# Patient Record
Sex: Female | Born: 1950
Health system: Southern US, Community
[De-identification: ages and names within clinical notes are randomized; demographics above are authoritative.]

## PROBLEM LIST (undated history)

## (undated) DIAGNOSIS — E7800 Pure hypercholesterolemia, unspecified: Secondary | ICD-10-CM

## (undated) DIAGNOSIS — I700 Atherosclerosis of aorta: Secondary | ICD-10-CM

## (undated) DIAGNOSIS — R7303 Prediabetes: Secondary | ICD-10-CM

## (undated) DIAGNOSIS — R079 Chest pain, unspecified: Secondary | ICD-10-CM

## (undated) DIAGNOSIS — M1990 Unspecified osteoarthritis, unspecified site: Secondary | ICD-10-CM

## (undated) DIAGNOSIS — I10 Essential (primary) hypertension: Secondary | ICD-10-CM

## (undated) DIAGNOSIS — K219 Gastro-esophageal reflux disease without esophagitis: Secondary | ICD-10-CM

## (undated) DIAGNOSIS — E785 Hyperlipidemia, unspecified: Secondary | ICD-10-CM

## (undated) DIAGNOSIS — I771 Stricture of artery: Secondary | ICD-10-CM

## (undated) DIAGNOSIS — G8929 Other chronic pain: Secondary | ICD-10-CM

## (undated) DIAGNOSIS — M7581 Other shoulder lesions, right shoulder: Secondary | ICD-10-CM

## (undated) DIAGNOSIS — M5136 Other intervertebral disc degeneration, lumbar region: Secondary | ICD-10-CM

## (undated) DIAGNOSIS — I2510 Atherosclerotic heart disease of native coronary artery without angina pectoris: Secondary | ICD-10-CM

## (undated) DIAGNOSIS — M79622 Pain in left upper arm: Secondary | ICD-10-CM

## (undated) DIAGNOSIS — M810 Age-related osteoporosis without current pathological fracture: Secondary | ICD-10-CM

## (undated) DIAGNOSIS — M48061 Spinal stenosis, lumbar region without neurogenic claudication: Secondary | ICD-10-CM

## (undated) DIAGNOSIS — M51369 Other intervertebral disc degeneration, lumbar region without mention of lumbar back pain or lower extremity pain: Secondary | ICD-10-CM

## (undated) DIAGNOSIS — T7840XA Allergy, unspecified, initial encounter: Secondary | ICD-10-CM

## (undated) DIAGNOSIS — K5792 Diverticulitis of intestine, part unspecified, without perforation or abscess without bleeding: Secondary | ICD-10-CM

## (undated) DIAGNOSIS — M549 Dorsalgia, unspecified: Secondary | ICD-10-CM

## (undated) HISTORY — DX: Other shoulder lesions, right shoulder: M75.81

## (undated) HISTORY — DX: Pure hypercholesterolemia, unspecified: E78.00

## (undated) HISTORY — PX: COLONOSCOPY: SHX174

## (undated) HISTORY — DX: Spinal stenosis, lumbar region without neurogenic claudication: M48.061

## (undated) HISTORY — PX: TONSILLECTOMY: SUR1361

## (undated) HISTORY — DX: Allergy, unspecified, initial encounter: T78.40XA

## (undated) HISTORY — DX: Chest pain, unspecified: R07.9

## (undated) HISTORY — DX: Gastro-esophageal reflux disease without esophagitis: K21.9

## (undated) HISTORY — DX: Dorsalgia, unspecified: M54.9

## (undated) HISTORY — PX: ABDOMINAL HYSTERECTOMY: SHX81

## (undated) HISTORY — DX: Pain in left upper arm: M79.622

## (undated) HISTORY — DX: Unspecified osteoarthritis, unspecified site: M19.90

## (undated) HISTORY — DX: Atherosclerosis of aorta: I70.0

## (undated) HISTORY — DX: Other intervertebral disc degeneration, lumbar region: M51.36

## (undated) HISTORY — DX: Hyperlipidemia, unspecified: E78.5

## (undated) HISTORY — DX: Essential (primary) hypertension: I10

## (undated) HISTORY — DX: Stricture of artery: I77.1

## (undated) HISTORY — DX: Prediabetes: R73.03

## (undated) HISTORY — DX: Atherosclerotic heart disease of native coronary artery without angina pectoris: I25.10

## (undated) HISTORY — DX: Other chronic pain: G89.29

## (undated) HISTORY — DX: Other intervertebral disc degeneration, lumbar region without mention of lumbar back pain or lower extremity pain: M51.369

## (undated) HISTORY — DX: Age-related osteoporosis without current pathological fracture: M81.0

---

## 1998-03-27 ENCOUNTER — Emergency Department (HOSPITAL_COMMUNITY): Admission: EM | Admit: 1998-03-27 | Discharge: 1998-03-27 | Payer: Self-pay | Admitting: Emergency Medicine

## 1998-11-30 ENCOUNTER — Encounter: Payer: Self-pay | Admitting: Surgery

## 1998-11-30 ENCOUNTER — Ambulatory Visit (HOSPITAL_COMMUNITY): Admission: RE | Admit: 1998-11-30 | Discharge: 1998-11-30 | Payer: Self-pay | Admitting: Surgery

## 1999-04-10 ENCOUNTER — Emergency Department (HOSPITAL_COMMUNITY): Admission: EM | Admit: 1999-04-10 | Discharge: 1999-04-10 | Payer: Self-pay | Admitting: Emergency Medicine

## 1999-04-10 ENCOUNTER — Encounter: Payer: Self-pay | Admitting: Emergency Medicine

## 1999-12-04 ENCOUNTER — Emergency Department (HOSPITAL_COMMUNITY): Admission: EM | Admit: 1999-12-04 | Discharge: 1999-12-04 | Payer: Self-pay | Admitting: Emergency Medicine

## 2000-09-15 ENCOUNTER — Ambulatory Visit (HOSPITAL_COMMUNITY): Admission: RE | Admit: 2000-09-15 | Discharge: 2000-09-15 | Payer: Self-pay | Admitting: Gastroenterology

## 2001-05-03 ENCOUNTER — Emergency Department (HOSPITAL_COMMUNITY): Admission: EM | Admit: 2001-05-03 | Discharge: 2001-05-03 | Payer: Self-pay | Admitting: Internal Medicine

## 2001-10-03 ENCOUNTER — Encounter: Payer: Self-pay | Admitting: Emergency Medicine

## 2001-10-03 ENCOUNTER — Emergency Department (HOSPITAL_COMMUNITY): Admission: EM | Admit: 2001-10-03 | Discharge: 2001-10-03 | Payer: Self-pay | Admitting: Emergency Medicine

## 2002-05-02 ENCOUNTER — Emergency Department (HOSPITAL_COMMUNITY): Admission: EM | Admit: 2002-05-02 | Discharge: 2002-05-02 | Payer: Self-pay | Admitting: Emergency Medicine

## 2002-07-04 ENCOUNTER — Ambulatory Visit (HOSPITAL_COMMUNITY): Admission: RE | Admit: 2002-07-04 | Discharge: 2002-07-04 | Payer: Self-pay

## 2002-07-04 ENCOUNTER — Encounter: Payer: Self-pay | Admitting: *Deleted

## 2002-07-11 ENCOUNTER — Encounter: Admission: RE | Admit: 2002-07-11 | Discharge: 2002-07-11 | Payer: Self-pay | Admitting: *Deleted

## 2002-07-11 ENCOUNTER — Encounter: Payer: Self-pay | Admitting: *Deleted

## 2003-04-19 ENCOUNTER — Emergency Department (HOSPITAL_COMMUNITY): Admission: EM | Admit: 2003-04-19 | Discharge: 2003-04-19 | Payer: Self-pay | Admitting: Emergency Medicine

## 2003-04-19 ENCOUNTER — Encounter: Payer: Self-pay | Admitting: Emergency Medicine

## 2003-08-07 ENCOUNTER — Encounter: Admission: RE | Admit: 2003-08-07 | Discharge: 2003-08-07 | Payer: Self-pay | Admitting: Internal Medicine

## 2004-09-03 ENCOUNTER — Encounter: Admission: RE | Admit: 2004-09-03 | Discharge: 2004-09-03 | Payer: Self-pay | Admitting: Internal Medicine

## 2004-10-09 ENCOUNTER — Ambulatory Visit (HOSPITAL_COMMUNITY): Admission: RE | Admit: 2004-10-09 | Discharge: 2004-10-09 | Payer: Self-pay | Admitting: Orthopedic Surgery

## 2004-10-09 ENCOUNTER — Ambulatory Visit (HOSPITAL_BASED_OUTPATIENT_CLINIC_OR_DEPARTMENT_OTHER): Admission: RE | Admit: 2004-10-09 | Discharge: 2004-10-09 | Payer: Self-pay | Admitting: Orthopedic Surgery

## 2005-02-26 ENCOUNTER — Emergency Department (HOSPITAL_COMMUNITY): Admission: EM | Admit: 2005-02-26 | Discharge: 2005-02-26 | Payer: Self-pay | Admitting: Emergency Medicine

## 2005-06-10 ENCOUNTER — Ambulatory Visit: Payer: Self-pay | Admitting: Gastroenterology

## 2005-06-20 ENCOUNTER — Ambulatory Visit: Payer: Self-pay | Admitting: Gastroenterology

## 2005-07-04 ENCOUNTER — Ambulatory Visit: Payer: Self-pay | Admitting: Gastroenterology

## 2005-07-14 ENCOUNTER — Encounter (INDEPENDENT_AMBULATORY_CARE_PROVIDER_SITE_OTHER): Payer: Self-pay | Admitting: *Deleted

## 2005-07-14 ENCOUNTER — Ambulatory Visit: Payer: Self-pay | Admitting: Gastroenterology

## 2005-10-22 ENCOUNTER — Encounter: Admission: RE | Admit: 2005-10-22 | Discharge: 2005-10-22 | Payer: Self-pay | Admitting: Internal Medicine

## 2006-09-07 ENCOUNTER — Ambulatory Visit: Payer: Self-pay

## 2006-11-06 ENCOUNTER — Encounter: Admission: RE | Admit: 2006-11-06 | Discharge: 2006-11-06 | Payer: Self-pay | Admitting: Internal Medicine

## 2007-02-24 ENCOUNTER — Emergency Department (HOSPITAL_COMMUNITY): Admission: EM | Admit: 2007-02-24 | Discharge: 2007-02-24 | Payer: Self-pay | Admitting: Family Medicine

## 2007-04-24 ENCOUNTER — Ambulatory Visit (HOSPITAL_COMMUNITY): Admission: RE | Admit: 2007-04-24 | Discharge: 2007-04-24 | Payer: Self-pay | Admitting: Internal Medicine

## 2007-11-01 ENCOUNTER — Ambulatory Visit: Payer: Self-pay | Admitting: Gastroenterology

## 2007-11-08 ENCOUNTER — Ambulatory Visit: Payer: Self-pay | Admitting: Gastroenterology

## 2007-12-13 ENCOUNTER — Encounter: Admission: RE | Admit: 2007-12-13 | Discharge: 2007-12-13 | Payer: Self-pay | Admitting: Internal Medicine

## 2007-12-21 ENCOUNTER — Ambulatory Visit: Payer: Self-pay | Admitting: Gastroenterology

## 2007-12-21 LAB — CONVERTED CEMR LAB
Fecal Occult Blood: NEGATIVE
OCCULT 1: NEGATIVE
OCCULT 2: NEGATIVE
OCCULT 3: NEGATIVE
OCCULT 4: NEGATIVE
OCCULT 5: NEGATIVE

## 2008-01-04 ENCOUNTER — Ambulatory Visit: Payer: Self-pay | Admitting: Gastroenterology

## 2008-04-27 ENCOUNTER — Ambulatory Visit: Payer: Self-pay | Admitting: Internal Medicine

## 2008-05-01 ENCOUNTER — Ambulatory Visit: Payer: Self-pay | Admitting: *Deleted

## 2008-06-22 ENCOUNTER — Ambulatory Visit: Payer: Self-pay | Admitting: Family Medicine

## 2008-06-22 LAB — CONVERTED CEMR LAB
ALT: 19 units/L (ref 0–35)
AST: 18 units/L (ref 0–37)
Albumin: 4.5 g/dL (ref 3.5–5.2)
Alkaline Phosphatase: 71 units/L (ref 39–117)
BUN: 17 mg/dL (ref 6–23)
Basophils Absolute: 0 10*3/uL (ref 0.0–0.1)
Basophils Relative: 0 % (ref 0–1)
CO2: 25 meq/L (ref 19–32)
Calcium: 9.6 mg/dL (ref 8.4–10.5)
Chloride: 105 meq/L (ref 96–112)
Cholesterol: 271 mg/dL — ABNORMAL HIGH (ref 0–200)
Creatinine, Ser: 0.86 mg/dL (ref 0.40–1.20)
Eosinophils Absolute: 0.1 10*3/uL (ref 0.0–0.7)
Eosinophils Relative: 2 % (ref 0–5)
Glucose, Bld: 94 mg/dL (ref 70–99)
HCT: 45 % (ref 36.0–46.0)
HDL: 51 mg/dL (ref >39)
Hemoglobin: 14.1 g/dL (ref 12.0–15.0)
LDL Cholesterol: 190 mg/dL — ABNORMAL HIGH (ref 0–99)
Lymphocytes Relative: 44 % (ref 12–46)
Lymphs Abs: 2.2 10*3/uL (ref 0.7–4.0)
MCHC: 31.3 g/dL (ref 30.0–36.0)
MCV: 82.6 fL (ref 78.0–100.0)
Monocytes Absolute: 0.4 10*3/uL (ref 0.1–1.0)
Monocytes Relative: 9 % (ref 3–12)
Neutro Abs: 2.3 10*3/uL (ref 1.7–7.7)
Neutrophils Relative %: 46 % (ref 43–77)
Platelets: 205 10*3/uL (ref 150–400)
Potassium: 4.2 meq/L (ref 3.5–5.3)
RBC: 5.45 M/uL — ABNORMAL HIGH (ref 3.87–5.11)
RDW: 16.9 % — ABNORMAL HIGH (ref 11.5–15.5)
Sed Rate: 7 mm/hr (ref 0–22)
Sodium: 141 meq/L (ref 135–145)
TSH: 1.01 microintl units/mL (ref 0.350–4.50)
Total Bilirubin: 0.4 mg/dL (ref 0.3–1.2)
Total CHOL/HDL Ratio: 5.3
Total Protein: 6.9 g/dL (ref 6.0–8.3)
Triglycerides: 152 mg/dL — ABNORMAL HIGH (ref <150)
VLDL: 30 mg/dL (ref 0–40)
WBC: 5 10*3/uL (ref 4.0–10.5)

## 2008-08-01 ENCOUNTER — Ambulatory Visit: Payer: Self-pay | Admitting: Family Medicine

## 2008-08-01 LAB — CONVERTED CEMR LAB
ALT: 47 units/L — ABNORMAL HIGH (ref 0–35)
AST: 26 units/L (ref 0–37)
Albumin: 4.4 g/dL (ref 3.5–5.2)
Alkaline Phosphatase: 68 units/L (ref 39–117)
BUN: 15 mg/dL (ref 6–23)
CO2: 24 meq/L (ref 19–32)
Calcium: 9.7 mg/dL (ref 8.4–10.5)
Chloride: 106 meq/L (ref 96–112)
Creatinine, Ser: 0.85 mg/dL (ref 0.40–1.20)
Glucose, Bld: 84 mg/dL (ref 70–99)
Potassium: 4.4 meq/L (ref 3.5–5.3)
Sodium: 141 meq/L (ref 135–145)
Total Bilirubin: 0.6 mg/dL (ref 0.3–1.2)
Total CK: 115 units/L (ref 7–177)
Total Protein: 6.9 g/dL (ref 6.0–8.3)

## 2008-09-04 ENCOUNTER — Ambulatory Visit: Payer: Self-pay | Admitting: Family Medicine

## 2008-09-11 ENCOUNTER — Ambulatory Visit (HOSPITAL_COMMUNITY): Admission: RE | Admit: 2008-09-11 | Discharge: 2008-09-11 | Payer: Self-pay | Admitting: Family Medicine

## 2008-12-13 ENCOUNTER — Encounter: Admission: RE | Admit: 2008-12-13 | Discharge: 2008-12-13 | Payer: Self-pay | Admitting: Family Medicine

## 2008-12-20 ENCOUNTER — Encounter: Admission: RE | Admit: 2008-12-20 | Discharge: 2008-12-20 | Payer: Self-pay | Admitting: Family Medicine

## 2009-01-19 ENCOUNTER — Ambulatory Visit: Payer: Self-pay | Admitting: Family Medicine

## 2009-02-01 ENCOUNTER — Ambulatory Visit: Payer: Self-pay | Admitting: Internal Medicine

## 2009-03-13 ENCOUNTER — Ambulatory Visit: Payer: Self-pay | Admitting: Family Medicine

## 2009-03-15 ENCOUNTER — Ambulatory Visit: Payer: Self-pay | Admitting: Internal Medicine

## 2009-07-13 ENCOUNTER — Ambulatory Visit: Payer: Self-pay | Admitting: Internal Medicine

## 2009-08-17 ENCOUNTER — Ambulatory Visit: Payer: Self-pay | Admitting: Family Medicine

## 2009-11-08 ENCOUNTER — Ambulatory Visit: Payer: Self-pay | Admitting: Internal Medicine

## 2009-11-08 LAB — CONVERTED CEMR LAB
Cholesterol: 161 mg/dL (ref 0–200)
HDL: 52 mg/dL (ref >39)
LDL Cholesterol: 89 mg/dL (ref 0–99)
Total CHOL/HDL Ratio: 3.1
Triglycerides: 98 mg/dL (ref <150)
VLDL: 20 mg/dL (ref 0–40)

## 2009-12-17 ENCOUNTER — Encounter: Admission: RE | Admit: 2009-12-17 | Discharge: 2009-12-17 | Payer: Self-pay | Admitting: Family Medicine

## 2009-12-20 ENCOUNTER — Ambulatory Visit: Payer: Self-pay | Admitting: Internal Medicine

## 2010-02-21 ENCOUNTER — Ambulatory Visit: Payer: Self-pay | Admitting: Internal Medicine

## 2010-09-16 ENCOUNTER — Emergency Department (HOSPITAL_COMMUNITY)
Admission: EM | Admit: 2010-09-16 | Discharge: 2010-09-16 | Payer: Self-pay | Source: Home / Self Care | Admitting: Emergency Medicine

## 2010-12-09 LAB — CBC
HCT: 42.1 % (ref 36.0–46.0)
Hemoglobin: 13.4 g/dL (ref 12.0–15.0)
MCH: 26.1 pg (ref 26.0–34.0)
MCHC: 31.8 g/dL (ref 30.0–36.0)
MCV: 82.1 fL (ref 78.0–100.0)
Platelets: 175 10*3/uL (ref 150–400)
RBC: 5.13 MIL/uL — ABNORMAL HIGH (ref 3.87–5.11)
RDW: 15.7 % — ABNORMAL HIGH (ref 11.5–15.5)
WBC: 6.1 10*3/uL (ref 4.0–10.5)

## 2010-12-09 LAB — POCT I-STAT, CHEM 8
BUN: 17 mg/dL (ref 6–23)
Calcium, Ion: 1.17 mmol/L (ref 1.12–1.32)
Chloride: 109 mEq/L (ref 96–112)
Creatinine, Ser: 0.8 mg/dL (ref 0.4–1.2)
Glucose, Bld: 94 mg/dL (ref 70–99)
HCT: 44 % (ref 36.0–46.0)
Hemoglobin: 15 g/dL (ref 12.0–15.0)
Potassium: 4.1 mEq/L (ref 3.5–5.1)
Sodium: 144 mEq/L (ref 135–145)
TCO2: 26 mmol/L (ref 0–100)

## 2011-02-03 ENCOUNTER — Other Ambulatory Visit (HOSPITAL_COMMUNITY): Payer: Self-pay | Admitting: *Deleted

## 2011-02-03 DIAGNOSIS — Z1231 Encounter for screening mammogram for malignant neoplasm of breast: Secondary | ICD-10-CM

## 2011-02-07 ENCOUNTER — Ambulatory Visit (HOSPITAL_COMMUNITY)
Admission: RE | Admit: 2011-02-07 | Discharge: 2011-02-07 | Disposition: A | Discharge status: Home / Self Care | Payer: Self-pay | Source: Ambulatory Visit | Attending: *Deleted | Admitting: *Deleted

## 2011-02-07 DIAGNOSIS — Z1231 Encounter for screening mammogram for malignant neoplasm of breast: Secondary | ICD-10-CM

## 2011-02-11 NOTE — Assessment & Plan Note (Signed)
[facility]                         GASTROENTEROLOGY OFFICE NOTE   NAME:HERRINGPrecious, Spence                      MRN:          [id number]  DATE:01/04/2008                            DOB:          1951-08-13    Veronica Spence is a 60 year old African-American female who is referred  for evaluation and treatment of reflux symptoms, dyspepsia, subxyphoid  pain, and solid food dysphagia, all of which has progressed over the  last several months despite taking maintenance Protonix therapy.  I  previously saw this patient and performed endoscopy for similar problems  and guaiac positive stools in February of 2009.  Colonoscopy at that  time was unremarkable.  She has previously had endoscopy in October of  2006 and had H. pylori and was treated with Prevpac.   She currently is having regular bowel movements on fiber supplements and  denies melena or hematochezia or hemorrhoidal bleeding.  She has known  lactose intolerance and is currently taking fiber supplements.  Her  appetite is good and her weight is stable.  She denies abuse of alcohol,  cigarettes, or NSAIDS.   MEDICATIONS:  1. She is on Vytorin 10 mg a day.  2. Aspirin 81 mg a day.  3. Protonix 40 mg a day.  4. P.r.n. Xanax.  5. Calcium with vitamin D.   She weighs 176 pounds.  Her blood pressure is 102/56.  I could not  appreciate stigmata of chronic liver disease, thyromegaly or  lymphadenopathy.  Her chest was clear and she was in a regular rhythm without murmurs,  gallops or rubs.  There was no hepatosplenomegaly, abdominal masses or tenderness noted.  There was no chest wall tenderness.  Bowel sounds were normal.  PERIPHERAL EXTREMITIES:  Were unremarkable.  Mental status was clear.   ASSESSMENT:  1. Chronic gastroesophageal reflux disease - rule out peptic stricture      esophagus.  2. Rule out recurrent H. pylori infection.  3. Constipation, predominate irritable bowel syndrome with    diverticulosis coli.  4. History of hyperlipidemia.   RECOMMENDATION:  1. Increased Protonix to 40 mg twice a day 30 minutes before breakfast      and supper.  2. Benefiber in cereal each morning with liberal p.o. fluids.  3. Follow up endoscopic exam.  4. Other medications per Dr. Darylene Price. Jarold Motto, MD, Caleen Essex, FAGA  Electronically Signed    DRP/MedQ  DD: 01/04/2008  DT: 01/04/2008  Job #: [id number]   cc:   Jonita Albee, M.D.

## 2011-02-14 NOTE — Procedures (Signed)
[facility]  Patient:    Veronica Spence, Veronica Spence                      MRN: [id number] Proc. Date: 09/15/00 Adm. Date:  [id number] Attending:  Orland Mustard CC:         Joycelyn Rua, M.D.   Procedure Report  PROCEDURE:  Colonoscopy.  MEDICATIONS:  Fentanyl 87.5 mcg, Versed 10 mg IV.  INDICATIONS FOR PROCEDURE:  Hematochezia.  DESCRIPTION OF PROCEDURE:  The procedure had been explained to the patient and consent obtained. With the patient in the left lateral decubitus position, the adult Olympus video colonoscope was inserted, advanced under direct vision. The prep was quite good. We were able to advance to the cecum with minimal difficulty. The ileocecal valve and appendiceal orifice were clearly seen. The scope was withdrawn and the cecum, ascending colon, hepatic flexure, transverse colon, splenic flexure, descending and sigmoid colon were seen well upon removal. No abnormalities seen. No polyps, no significant diverticular disease, no tumor masses. The scope was withdrawn to the rectum. The patient did have moderate internal hemorrhoids in the rectum. No polyps were seen. She tolerated the procedure well and was maintained on low flow oxygen and pulse oximeter throughout the procedure with no obvious problems.  ASSESSMENT:  Internal hemorrhoids probably the source of her hematochezia.  PLAN:  Will give instructions about hemorrhoids and plan on seeing back in the office in 2-3 months. DD:  09/15/00 TD:  09/16/00 Job: [id number]

## 2011-02-14 NOTE — Op Note (Signed)
Veronica Spence, EBARB             ACCOUNT NO.:  [id number]   MEDICAL RECORD NO.:  [id number]          PATIENT TYPE:  AMB   LOCATION:  DSC                          FACILITY:  [facility]   PHYSICIAN:  Cindee Salt, M.D.       DATE OF BIRTH:  1951/02/05   DATE OF PROCEDURE:  10/09/2004  DATE OF DISCHARGE:                                 OPERATIVE REPORT   PREOPERATIVE DIAGNOSIS:  Carpal tunnel syndrome right hand.   POSTOPERATIVE DIAGNOSIS:  Carpal tunnel syndrome right hand.   OPERATION:  Decompression right median nerve.   SURGEON:  Cindee Salt, M.D.   ASSISTANTCarolyne Fiscal.   ANESTHESIA:  Forearm based IV regional.   HISTORY:  The patient is 60 year old female with a history of carpal tunnel  syndrome EMG nerve conductions positive which has not responded to  conservative to conservative treatment.   PROCEDURE:  The patient is brought to the operating room where forearm based  IV regional anesthetic was carried out without difficulty. She was prepped  using DuraPrep, supine position, right arm free. A longitudinal incision was  made in the palm and carried down through subcutaneous tissue. Bleeders were  electrocauterized. Palmar fascia was split. Superficial palmar arch  identified.  The  flexor tendon to the ring and little finger identified to  the ulnar side of the median nerve.  The carpal retinaculum was incised with  sharp dissection.  Right angle and Sewell retractor were placed between skin  form fashion. The fascia was released for approximately 1.5 cm proximal to  the wrist crease under direct vision.  Canal was explored and no further  lesions were identified.  Tenosynovial tissue was markedly thickened.  The  wound was irrigated. Skin was closed interrupted 5-0 nylon sutures. A  sterile compressive dressing and splint was applied. The patient tolerated  the procedure well and was taken to the recovery room for observation in  satisfactory condition. She is discharged home  drains of [place] in one  week on Vicodin,       GK/MEDQ  D:  10/09/2004  T:  10/09/2004  Job:  [id number]

## 2011-03-07 ENCOUNTER — Emergency Department (HOSPITAL_COMMUNITY)
Admission: EM | Admit: 2011-03-07 | Discharge: 2011-03-07 | Disposition: A | Discharge status: Home / Self Care | Payer: Self-pay | Attending: Emergency Medicine | Admitting: Emergency Medicine

## 2011-03-07 DIAGNOSIS — N390 Urinary tract infection, site not specified: Secondary | ICD-10-CM | POA: Insufficient documentation

## 2011-03-07 DIAGNOSIS — M545 Low back pain, unspecified: Secondary | ICD-10-CM | POA: Insufficient documentation

## 2011-03-07 LAB — URINALYSIS, ROUTINE W REFLEX MICROSCOPIC
Ketones, ur: NEGATIVE mg/dL
Protein, ur: NEGATIVE mg/dL
Specific Gravity, Urine: 1.023 (ref 1.005–1.030)
Urobilinogen, UA: 0.2 mg/dL (ref 0.0–1.0)

## 2011-03-07 LAB — URINE MICROSCOPIC-ADD ON

## 2012-04-15 ENCOUNTER — Other Ambulatory Visit (HOSPITAL_COMMUNITY): Payer: Self-pay | Admitting: Family Medicine

## 2012-04-15 DIAGNOSIS — Z1231 Encounter for screening mammogram for malignant neoplasm of breast: Secondary | ICD-10-CM

## 2012-05-04 ENCOUNTER — Ambulatory Visit (HOSPITAL_COMMUNITY)
Admission: RE | Admit: 2012-05-04 | Discharge: 2012-05-04 | Disposition: A | Discharge status: Home / Self Care | Payer: Self-pay | Source: Ambulatory Visit | Attending: Family Medicine | Admitting: Family Medicine

## 2012-05-04 DIAGNOSIS — Z1231 Encounter for screening mammogram for malignant neoplasm of breast: Secondary | ICD-10-CM | POA: Insufficient documentation

## 2012-05-05 ENCOUNTER — Emergency Department (HOSPITAL_COMMUNITY): Payer: Self-pay

## 2012-05-05 ENCOUNTER — Encounter (HOSPITAL_COMMUNITY): Payer: Self-pay | Admitting: *Deleted

## 2012-05-05 ENCOUNTER — Emergency Department (HOSPITAL_COMMUNITY)
Admission: EM | Admit: 2012-05-05 | Discharge: 2012-05-05 | Disposition: A | Discharge status: Home / Self Care | Payer: Self-pay | Attending: Emergency Medicine | Admitting: Emergency Medicine

## 2012-05-05 DIAGNOSIS — N390 Urinary tract infection, site not specified: Secondary | ICD-10-CM | POA: Insufficient documentation

## 2012-05-05 DIAGNOSIS — F17200 Nicotine dependence, unspecified, uncomplicated: Secondary | ICD-10-CM | POA: Insufficient documentation

## 2012-05-05 DIAGNOSIS — E7800 Pure hypercholesterolemia, unspecified: Secondary | ICD-10-CM | POA: Insufficient documentation

## 2012-05-05 DIAGNOSIS — Z90710 Acquired absence of both cervix and uterus: Secondary | ICD-10-CM | POA: Insufficient documentation

## 2012-05-05 HISTORY — DX: Pure hypercholesterolemia, unspecified: E78.00

## 2012-05-05 HISTORY — DX: Diverticulitis of intestine, part unspecified, without perforation or abscess without bleeding: K57.92

## 2012-05-05 LAB — POCT I-STAT, CHEM 8
Calcium, Ion: 1.23 mmol/L (ref 1.13–1.30)
Glucose, Bld: 91 mg/dL (ref 70–99)
HCT: 42 % (ref 36.0–46.0)
Hemoglobin: 14.3 g/dL (ref 12.0–15.0)
TCO2: 22 mmol/L (ref 0–100)

## 2012-05-05 LAB — URINALYSIS, ROUTINE W REFLEX MICROSCOPIC
Glucose, UA: NEGATIVE mg/dL
Ketones, ur: NEGATIVE mg/dL
Nitrite: NEGATIVE
Protein, ur: NEGATIVE mg/dL
Specific Gravity, Urine: 1.023 (ref 1.005–1.030)
Urobilinogen, UA: 0.2 mg/dL (ref 0.0–1.0)
pH: 6 (ref 5.0–8.0)

## 2012-05-05 LAB — CBC WITH DIFFERENTIAL/PLATELET
Basophils Relative: 0 % (ref 0–1)
Eosinophils Absolute: 0.1 10*3/uL (ref 0.0–0.7)
HCT: 40.9 % (ref 36.0–46.0)
Hemoglobin: 13.2 g/dL (ref 12.0–15.0)
MCH: 25.5 pg — ABNORMAL LOW (ref 26.0–34.0)
MCHC: 32.3 g/dL (ref 30.0–36.0)
Monocytes Absolute: 0.5 10*3/uL (ref 0.1–1.0)
Monocytes Relative: 10 % (ref 3–12)
Neutrophils Relative %: 47 % (ref 43–77)

## 2012-05-05 MED ORDER — CIPROFLOXACIN HCL 500 MG PO TABS: 500.0000 mg | ORAL_TABLET | Freq: Two times a day (BID) | ORAL | Status: AC

## 2012-05-05 MED ORDER — DEXTROSE 5 % IV SOLN: 1.0000 g | INTRAVENOUS | Status: DC

## 2012-05-05 MED ORDER — CIPROFLOXACIN HCL 500 MG PO TABS
500.0000 mg | ORAL_TABLET | Freq: Once | ORAL | Status: AC
  Administered 2012-05-05: 500 mg via ORAL
  Filled 2012-05-05: qty 1

## 2012-05-05 NOTE — ED Provider Notes (Signed)
History     CSN: [id number]  Arrival date & time 05/05/12  1332   First MD Initiated Contact with Patient 05/05/12 1349      Chief Complaint  Patient presents with  . Generalized Body Aches  . Abdominal Pain    (Consider location/radiation/quality/duration/timing/severity/associated sxs/prior treatment) Patient is a 61 y.o. female presenting with abdominal pain. The history is provided by the patient.  Abdominal Pain The primary symptoms of the illness include abdominal pain.   patient here with myalgias and joint aches x3 days. Cough has been slightly productive. Some lower back pain with dysuria. Patient denies fever, vomiting, diarrhea. Denies abdominal pain or chest pain. Has used over-the-counter medications without relief. History of similar symptoms in the past associated with pneumonia  Past Medical History  Diagnosis Date  . Hypercholesteremia   . Diverticulitis     Past Surgical History  Procedure Date  . Abdominal hysterectomy     No family history on file.  History  Substance Use Topics  . Smoking status: Current Everyday Smoker  . Smokeless tobacco: Not on file  . Alcohol Use: No    OB History    Grav Para Term Preterm Abortions TAB SAB Ect Mult Living                  Review of Systems  Gastrointestinal: Positive for abdominal pain.  All other systems reviewed and are negative.    Allergies  Review of patient's allergies indicates no known allergies.  Home Medications   Current Outpatient Rx  Name Route Sig Dispense Refill  . ASPIRIN 81 MG PO CHEW Oral Chew 81 mg by mouth daily.    Marland Kitchen EZETIMIBE-SIMVASTATIN 10-20 MG PO TABS Oral Take 1 tablet by mouth at bedtime.    . CENTRUM SILVER PO Oral Take 1 tablet by mouth daily.      BP 135/72  Pulse 70  Temp 98.6 F (37 C) (Oral)  Resp 16  SpO2 99%  Physical Exam  Nursing note and vitals reviewed. Constitutional: She is oriented to person, place, and time. Vital signs are normal. She  appears well-developed and well-nourished.  Non-toxic appearance. No distress.  HENT:  Head: Normocephalic and atraumatic.  Eyes: Conjunctivae, EOM and lids are normal. Pupils are equal, round, and reactive to light.  Neck: Normal range of motion. Neck supple. No tracheal deviation present. No mass present.  Cardiovascular: Normal rate, regular rhythm and normal heart sounds.  Exam reveals no gallop.   No murmur heard. Pulmonary/Chest: Effort normal and breath sounds normal. No stridor. No respiratory distress. She has no decreased breath sounds. She has no wheezes. She has no rhonchi. She has no rales.  Abdominal: Soft. Normal appearance and bowel sounds are normal. She exhibits no distension. There is no tenderness. There is no rebound and no CVA tenderness.  Musculoskeletal: Normal range of motion. She exhibits no edema and no tenderness.  Neurological: She is alert and oriented to person, place, and time. She has normal strength. No cranial nerve deficit or sensory deficit. GCS eye subscore is 4. GCS verbal subscore is 5. GCS motor subscore is 6.  Skin: Skin is warm and dry. No abrasion and no rash noted.  Psychiatric: She has a normal mood and affect. Her speech is normal and behavior is normal.    ED Course  Procedures (including critical care time)   Labs Reviewed  URINALYSIS, ROUTINE W REFLEX MICROSCOPIC  URINE CULTURE  CBC WITH DIFFERENTIAL   Mm Digital Screening  05/05/2012  *RADIOLOGY REPORT*  Clinical Data: Screening.  DIGITAL BILATERAL SCREENING MAMMOGRAM WITH CAD  Comparison:  Previous exams.  Findings:  The breast tissue is heterogeneously dense. No suspicious masses, architectural distortion, or calcifications are present.  Images were processed with CAD.  IMPRESSION: No mammographic evidence of malignancy.  A result letter of this screening mammogram will be mailed directly to the patient.  RECOMMENDATION: Screening mammogram in one year. (Code:SM-B-01Y)  BI-RADS CATEGORY 1:   Negative.  Original Report Authenticated By: [email or web address]     No diagnosis found.    MDM  Patient to be treated for her UTI with Cipro Floxin       Toy Baker, MD 05/05/12 1527

## 2012-05-05 NOTE — Progress Notes (Signed)
CM spoke with pt while in [facility] ED She confirms she is self pay [facility] resident with no pcp. Discussed the importance of a pcp for f/u. Reviewed Health connect number to assist with finding self pay provider close to pt's residence. Reviewed resources for [facility], general medical clinics, medications-needymeds.com, [facility] out [facility], housing, DSS, health Department and other resources in [place]. Pt voiced understanding and appreciation of resources provided

## 2012-05-05 NOTE — ED Notes (Signed)
Pt reports generalized body aches, abdominal pain since Sunday. One episode diarrhea on Monday. No nausea/vomiting. Denies fever. Reports diaphoresis.

## 2012-05-07 LAB — URINE CULTURE

## 2013-03-17 ENCOUNTER — Emergency Department (HOSPITAL_COMMUNITY)
Admission: EM | Admit: 2013-03-17 | Discharge: 2013-03-17 | Disposition: A | Discharge status: Home / Self Care | Payer: Self-pay | Attending: Emergency Medicine | Admitting: Emergency Medicine

## 2013-03-17 ENCOUNTER — Encounter (HOSPITAL_COMMUNITY): Payer: Self-pay | Admitting: Emergency Medicine

## 2013-03-17 DIAGNOSIS — Z8719 Personal history of other diseases of the digestive system: Secondary | ICD-10-CM | POA: Insufficient documentation

## 2013-03-17 DIAGNOSIS — Z7982 Long term (current) use of aspirin: Secondary | ICD-10-CM | POA: Insufficient documentation

## 2013-03-17 DIAGNOSIS — F17200 Nicotine dependence, unspecified, uncomplicated: Secondary | ICD-10-CM | POA: Insufficient documentation

## 2013-03-17 DIAGNOSIS — L02219 Cutaneous abscess of trunk, unspecified: Secondary | ICD-10-CM | POA: Insufficient documentation

## 2013-03-17 DIAGNOSIS — R300 Dysuria: Secondary | ICD-10-CM | POA: Insufficient documentation

## 2013-03-17 DIAGNOSIS — R3915 Urgency of urination: Secondary | ICD-10-CM | POA: Insufficient documentation

## 2013-03-17 DIAGNOSIS — M5432 Sciatica, left side: Secondary | ICD-10-CM

## 2013-03-17 DIAGNOSIS — E7800 Pure hypercholesterolemia, unspecified: Secondary | ICD-10-CM | POA: Insufficient documentation

## 2013-03-17 DIAGNOSIS — N61 Mastitis without abscess: Secondary | ICD-10-CM

## 2013-03-17 DIAGNOSIS — Z87440 Personal history of urinary (tract) infections: Secondary | ICD-10-CM | POA: Insufficient documentation

## 2013-03-17 DIAGNOSIS — M5430 Sciatica, unspecified side: Secondary | ICD-10-CM | POA: Insufficient documentation

## 2013-03-17 LAB — POCT I-STAT, CHEM 8
BUN: 12 mg/dL (ref 6–23)
Creatinine, Ser: 0.8 mg/dL (ref 0.50–1.10)
Sodium: 143 mEq/L (ref 135–145)
TCO2: 26 mmol/L (ref 0–100)

## 2013-03-17 LAB — URINALYSIS, ROUTINE W REFLEX MICROSCOPIC
Glucose, UA: NEGATIVE mg/dL
Specific Gravity, Urine: 1.026 (ref 1.005–1.030)
Urobilinogen, UA: 0.2 mg/dL (ref 0.0–1.0)

## 2013-03-17 MED ORDER — HYDROCODONE-ACETAMINOPHEN 5-325 MG PO TABS: 2.0000 | ORAL_TABLET | ORAL | Status: DC | PRN

## 2013-03-17 MED ORDER — SULFAMETHOXAZOLE-TRIMETHOPRIM 800-160 MG PO TABS: 1.0000 | ORAL_TABLET | Freq: Two times a day (BID) | ORAL | Status: DC

## 2013-03-17 MED ORDER — METHOCARBAMOL 500 MG PO TABS: 500.0000 mg | ORAL_TABLET | Freq: Two times a day (BID) | ORAL | Status: DC

## 2013-03-17 NOTE — ED Notes (Signed)
Pt states that she has dark urine and back pain for the past 3 days now  With some frequency states that she thinks it might be a uti.

## 2013-03-17 NOTE — ED Provider Notes (Signed)
History     CSN: [id number]  Arrival date & time 03/17/13  1316   First MD Initiated Contact with Patient 03/17/13 1319      Chief Complaint  Patient presents with  . Back Pain  . Urinary Frequency    (Consider location/radiation/quality/duration/timing/severity/associated sxs/prior treatment) HPI  Veronica Spence is a 62 y.o.female presenting to the ER with complaints of urinary frequency, hesitancy and dark urine for 3 days. Also she is having left hip pain that radiates down her left leg that started yesterday. She tells me otherwise she has felt good. Hx of UTIs. No fevers,  n/v/d weakness, no flank or abdominal pains.. She declines pain medication when offered. She has borderline high cholesterol but denies any other medical conditions and says she is still very active.   Patient also wants me to evaluate her right breast.She got bit by some insects a few days ago and now it is tender and red at the area.   Past Medical History  Diagnosis Date  . Hypercholesteremia   . Diverticulitis     Past Surgical History  Procedure Laterality Date  . Abdominal hysterectomy      No family history on file.  History  Substance Use Topics  . Smoking status: Current Every Day Smoker  . Smokeless tobacco: Not on file  . Alcohol Use: No    OB History   Grav Para Term Preterm Abortions TAB SAB Ect Mult Living                  Review of Systems  Genitourinary: Positive for dysuria, urgency and frequency.  Musculoskeletal: Positive for back pain.  All other systems reviewed and are negative.    Allergies  Review of patient's allergies indicates no known allergies.  Home Medications   Current Outpatient Rx  Name  Route  Sig  Dispense  Refill  . aspirin EC 81 MG tablet   Oral   Take 81 mg by mouth daily.         . calcium carbonate (OS-CAL) 600 MG TABS   Oral   Take 600 mg by mouth 2 (two) times daily with a meal.         . HYDROcodone-acetaminophen  (NORCO/VICODIN) 5-325 MG per tablet   Oral   Take 1 tablet by mouth every 6 (six) hours as needed for pain.         . Multiple Vitamin (MULTIVITAMIN WITH MINERALS) TABS   Oral   Take 1 tablet by mouth daily.         Marland Kitchen HYDROcodone-acetaminophen (NORCO/VICODIN) 5-325 MG per tablet   Oral   Take 2 tablets by mouth every 4 (four) hours as needed for pain.   6 tablet   0   . methocarbamol (ROBAXIN) 500 MG tablet   Oral   Take 1 tablet (500 mg total) by mouth 2 (two) times daily.   20 tablet   0   . sulfamethoxazole-trimethoprim (SEPTRA DS) 800-160 MG per tablet   Oral   Take 1 tablet by mouth every 12 (twelve) hours.   10 tablet   0     BP 131/80  Pulse 98  Temp(Src) 99 F (37.2 C) (Oral)  Resp 16  Ht 5\' 6"  (1.676 m)  Wt 198 lb (89.812 kg)  BMI 31.97 kg/m2  SpO2 99%  Physical Exam  Nursing note and vitals reviewed. Constitutional: She appears well-developed and well-nourished. No distress.  HENT:  Head: Normocephalic and atraumatic.  Eyes: Pupils  are equal, round, and reactive to light.  Neck: Normal range of motion. Neck supple.  Cardiovascular: Normal rate and regular rhythm.   Pulmonary/Chest: Effort normal.    Abdominal: Soft. She exhibits no distension. There is no tenderness. There is no rebound and no guarding.  Musculoskeletal:       Left hip: She exhibits normal range of motion, normal strength, no tenderness, no bony tenderness, no swelling, no crepitus, no deformity and no laceration.       Legs: No decreased strength or ROM.  Neurological: She is alert.  Skin: Skin is warm and dry.    ED Course  Procedures (including critical care time)  Labs Reviewed  URINALYSIS, ROUTINE W REFLEX MICROSCOPIC - Abnormal; Notable for the following:    Hgb urine dipstick SMALL (*)    Leukocytes, UA SMALL (*)    All other components within normal limits  URINE MICROSCOPIC-ADD ON - Abnormal; Notable for the following:    Squamous Epithelial / LPF FEW (*)     All other components within normal limits  URINE CULTURE  POCT I-STAT, CHEM 8   No results found.   1. Sciatica neuralgia, left   2. Cellulitis of breast       MDM  Patients urine does not show any significant abnormalities. Delay in getting CBC and BMP because of access to chem 8 ordered. Pt symptoms appear to be more of a sciatica without midline tenderness or weakness, pain in the leg. No bowel or urine incontinence. I offered/recommended pt CT scan or abd/pelv but she declined because she is pressed for time and needs to get her grandkids. She said she is not really having back pain or abdominal pain. Says she will come back to the ER if anything changes.     i-stat 8 is all WNL.  Rx: Bactrim for cellulitis.      62 y.o.Veronica Spence's evaluation in the Emergency Department is complete. It has been determined that no acute conditions requiring further emergency intervention are present at this time. The patient/guardian have been advised of the diagnosis and plan. We have discussed signs and symptoms that warrant return to the ED, such as changes or worsening in symptoms.  Vital signs are stable at discharge. Filed Vitals:   03/17/13 1349  BP: 131/80  Pulse: 98  Temp: 99 F (37.2 C)  Resp: 16    Patient/guardian has voiced understanding and agreed to follow-up with the PCP or specialist.      Dorthula Matas, PA-C 03/17/13 1653

## 2013-03-17 NOTE — ED Notes (Signed)
PA and RN at bedside:  Recommended abdominal CT scan but pt refused because she must pick up her grandchildren.  PA requested that pt come back for a CT scan if pain increases.

## 2013-03-17 NOTE — Progress Notes (Addendum)
During [facility] ED 03/17/13 visit CM spoke with pt who confirms self pay [facility] resident with no pcp. States she was previously a health serve with an expired orange card Pt gave Cm permission to provide her name to [facility] liaison to assist with renewal of orange card Cm also provided her with the following information: [facility] community care network assists with discounted doctors through "orange card services Call Ewell Poe at [phone]  CM discussed and provided written information for self pay pcps, importance of pcp for f/u care, www.needymeds.org, discounted pharmacies and other [place] resources such as financial assistance, DSS and  health department  Reviewed resources for [employer] self pay pcps like [facility], family medicine at [address], [place] [facility], general medical clinics, [facility] urgent care plus others, [facility] and housing Pt voiced understanding and appreciation of resources provided  Pt stated EDP states she has sciatica issues Pt stated she was thirsty and wanted food ED RN notified

## 2013-03-18 LAB — URINE CULTURE

## 2013-03-18 NOTE — ED Provider Notes (Signed)
Medical screening examination/treatment/procedure(s) were performed by non-physician practitioner and as supervising physician I was immediately available for consultation/collaboration.   Celene Kras, MD 03/18/13 [phone]

## 2013-03-18 NOTE — Progress Notes (Signed)
Referral provided to [facility] so a letter and information can be sent to pt abotu "orange card" re instatement/application as pt provided permission for

## 2013-04-21 ENCOUNTER — Other Ambulatory Visit (HOSPITAL_COMMUNITY): Payer: Self-pay | Admitting: General Practice

## 2013-05-10 ENCOUNTER — Other Ambulatory Visit (HOSPITAL_COMMUNITY): Payer: Self-pay | Admitting: General Practice

## 2013-05-10 DIAGNOSIS — Z1231 Encounter for screening mammogram for malignant neoplasm of breast: Secondary | ICD-10-CM

## 2013-05-19 ENCOUNTER — Ambulatory Visit (HOSPITAL_COMMUNITY)
Admission: RE | Admit: 2013-05-19 | Discharge: 2013-05-19 | Disposition: A | Discharge status: Home / Self Care | Payer: Self-pay | Source: Ambulatory Visit | Attending: General Practice | Admitting: General Practice

## 2013-05-19 DIAGNOSIS — Z1231 Encounter for screening mammogram for malignant neoplasm of breast: Secondary | ICD-10-CM

## 2013-08-30 ENCOUNTER — Emergency Department (HOSPITAL_COMMUNITY)
Admission: EM | Admit: 2013-08-30 | Discharge: 2013-08-30 | Disposition: A | Discharge status: Home / Self Care | Payer: No Typology Code available for payment source | Attending: Emergency Medicine | Admitting: Emergency Medicine

## 2013-08-30 ENCOUNTER — Encounter (HOSPITAL_COMMUNITY): Payer: Self-pay | Admitting: Emergency Medicine

## 2013-08-30 DIAGNOSIS — F17200 Nicotine dependence, unspecified, uncomplicated: Secondary | ICD-10-CM | POA: Insufficient documentation

## 2013-08-30 DIAGNOSIS — K047 Periapical abscess without sinus: Secondary | ICD-10-CM | POA: Insufficient documentation

## 2013-08-30 DIAGNOSIS — Z8719 Personal history of other diseases of the digestive system: Secondary | ICD-10-CM | POA: Insufficient documentation

## 2013-08-30 DIAGNOSIS — E7800 Pure hypercholesterolemia, unspecified: Secondary | ICD-10-CM | POA: Insufficient documentation

## 2013-08-30 DIAGNOSIS — Z79899 Other long term (current) drug therapy: Secondary | ICD-10-CM | POA: Insufficient documentation

## 2013-08-30 DIAGNOSIS — Z7982 Long term (current) use of aspirin: Secondary | ICD-10-CM | POA: Insufficient documentation

## 2013-08-30 MED ORDER — HYDROCODONE-ACETAMINOPHEN 5-325 MG PO TABS: 1.0000 | ORAL_TABLET | Freq: Four times a day (QID) | ORAL | Status: DC | PRN

## 2013-08-30 MED ORDER — PENICILLIN V POTASSIUM 500 MG PO TABS: 500.0000 mg | ORAL_TABLET | Freq: Four times a day (QID) | ORAL | Status: DC

## 2013-08-30 MED ORDER — IBUPROFEN 800 MG PO TABS
800.0000 mg | ORAL_TABLET | Freq: Once | ORAL | Status: AC
  Administered 2013-08-30: 800 mg via ORAL
  Filled 2013-08-30: qty 1

## 2013-08-30 NOTE — ED Notes (Signed)
Pt states last night right side lower gums were bothering her and she woke up this morning to the right lower side swollen and hard. 5/10 pain.  Denies injury or drainage.

## 2013-08-30 NOTE — ED Provider Notes (Signed)
CSN: [id number]     Arrival date & time 08/30/13  1232 History   First MD Initiated Contact with Patient 08/30/13 1308   This chart was scribed for non-physician practitioner Francee Piccolo, PA-C working with Raeford Razor, MD by Valera Castle, ED scribe. This patient was seen in room WTR6/WTR6 and the patient's care was started at 1:50 PM.  Chief Complaint  Patient presents with  . Dental Pain    The history is provided by the patient. No language interpreter was used.   HPI Comments: Veronica Spence is a 62 y.o. female who presents to the Emergency Department complaining of sudden, mild, constant, right sided, lower gum pain, with a severity of 5/10, onset last night. She reports waking up this morning with associated swelling to the area, stating her gums felt hard. She reports that the pain radiates to the ride side of her face, and also reports warmth to the area. She denies any drainage from the area. She states that nothing exacerbates the area. She states that rubbing the area helps relieve some of the pain. She denies taking medication regularly. She denies any allergies. She denies a h/o similar symptoms. She denies any fever, cough, sore throat, any other facial swelling, diaphoresis, shoulder pain, new back pain, chest pain, SOB, and any other associated symptoms. She denies any pertinent medical history. She denies having a dentist. She reports driving herself to the ER today.  PCP - No primary provider on file.  Past Medical History  Diagnosis Date  . Hypercholesteremia   . Diverticulitis    Past Surgical History  Procedure Laterality Date  . Abdominal hysterectomy     No family history on file. History  Substance Use Topics  . Smoking status: Current Every Day Smoker  . Smokeless tobacco: Not on file  . Alcohol Use: No   OB History   Grav Para Term Preterm Abortions TAB SAB Ect Mult Living                 Review of Systems  Constitutional: Negative for fever  and diaphoresis.  HENT: Positive for facial swelling (right sided, lower gums). Negative for sore throat.   Respiratory: Negative for cough, chest tightness and shortness of breath.   Cardiovascular: Negative for chest pain.  Musculoskeletal: Negative for arthralgias.  All other systems reviewed and are negative.   Allergies  Tetracyclines & related  Home Medications   Current Outpatient Rx  Name  Route  Sig  Dispense  Refill  . aspirin EC 81 MG tablet   Oral   Take 81 mg by mouth daily.         Marland Kitchen ezetimibe-simvastatin (VYTORIN) 10-20 MG per tablet   Oral   Take 1 tablet by mouth daily.         Marland Kitchen ibuprofen (ADVIL,MOTRIN) 800 MG tablet   Oral   Take 800 mg by mouth every 8 (eight) hours as needed (pain).         . Multiple Vitamin (MULTIVITAMIN WITH MINERALS) TABS   Oral   Take 1 tablet by mouth daily.         Marland Kitchen HYDROcodone-acetaminophen (NORCO/VICODIN) 5-325 MG per tablet   Oral   Take 1-2 tablets by mouth every 6 (six) hours as needed for severe pain.   15 tablet   0   . penicillin v potassium (VEETID) 500 MG tablet   Oral   Take 1 tablet (500 mg total) by mouth 4 (four) times daily.  40 tablet   0    BP 161/72  Pulse 73  Temp(Src) 98.6 F (37 C) (Oral)  Resp 20  SpO2 100%  Physical Exam  Nursing note and vitals reviewed. Constitutional: She is oriented to person, place, and time. She appears well-developed and well-nourished. No distress.  HENT:  Head: Normocephalic and atraumatic.  Right Ear: External ear normal.  Left Ear: External ear normal.  Nose: Nose normal.  Mouth/Throat: Uvula is midline, oropharynx is clear and moist and mucous membranes are normal. No oral lesions. No trismus in the jaw. Abnormal dentition. Dental abscesses and dental caries present. No uvula swelling or lacerations.  Right lower gum swelling w/o obvious abscess for draining. TTP. No purulent drainage noted.  No submandibular swelling.   Eyes: Conjunctivae are  normal.  Neck: Normal range of motion. Neck supple.  Cardiovascular: Normal rate.   Pulmonary/Chest: Effort normal.  Abdominal: Soft.  Musculoskeletal: Normal range of motion.  Neurological: She is alert and oriented to person, place, and time.  Skin: Skin is warm and dry. She is not diaphoretic.  Psychiatric: She has a normal mood and affect.    ED Course  Procedures (including critical care time) Medications  ibuprofen (ADVIL,MOTRIN) tablet 800 mg (800 mg Oral Given 08/30/13 1426)     DIAGNOSTIC STUDIES: Oxygen Saturation is 100% on room air, normal by my interpretation.    COORDINATION OF CARE: 1:53 PM-Discussed treatment plan which includes referral to a dentist with pt at bedside and pt agreed to plan.   Labs Review Labs Reviewed - No data to display Imaging Review No results found.  EKG Interpretation   None       MDM   1. Dental abscess     Afebrile, NAD, non-toxic appearing, AAOx4. Patient with toothache.  No gross abscess.  Exam unconcerning for Ludwig's angina or spread of infection.  Will treat with penicillin and pain medicine.  Urged patient to follow-up with dentist. Return precautions discussed. Patient is agreeable to plan. Patient is stable at time of discharge. Patient d/w with Dr. Juleen China, agrees with plan.         I personally performed the services described in this documentation, which was scribed in my presence. The recorded information has been reviewed and is accurate.    Jeannetta Ellis, PA-C 08/30/13 1443

## 2013-08-31 NOTE — ED Provider Notes (Signed)
Medical screening examination/treatment/procedure(s) were performed by non-physician practitioner and as supervising physician I was immediately available for consultation/collaboration.  EKG Interpretation   None        Mazzie Brodrick, MD 08/31/13 1802 

## 2013-11-02 ENCOUNTER — Encounter (HOSPITAL_COMMUNITY): Payer: Self-pay | Admitting: Emergency Medicine

## 2013-11-02 ENCOUNTER — Emergency Department (HOSPITAL_COMMUNITY): Payer: No Typology Code available for payment source

## 2013-11-02 ENCOUNTER — Emergency Department (HOSPITAL_COMMUNITY)
Admission: EM | Admit: 2013-11-02 | Discharge: 2013-11-02 | Disposition: A | Discharge status: Home / Self Care | Payer: No Typology Code available for payment source | Attending: Emergency Medicine | Admitting: Emergency Medicine

## 2013-11-02 DIAGNOSIS — M7120 Synovial cyst of popliteal space [Baker], unspecified knee: Secondary | ICD-10-CM | POA: Insufficient documentation

## 2013-11-02 DIAGNOSIS — Z8719 Personal history of other diseases of the digestive system: Secondary | ICD-10-CM | POA: Insufficient documentation

## 2013-11-02 DIAGNOSIS — M1711 Unilateral primary osteoarthritis, right knee: Secondary | ICD-10-CM

## 2013-11-02 DIAGNOSIS — E7800 Pure hypercholesterolemia, unspecified: Secondary | ICD-10-CM | POA: Insufficient documentation

## 2013-11-02 DIAGNOSIS — Z7982 Long term (current) use of aspirin: Secondary | ICD-10-CM | POA: Insufficient documentation

## 2013-11-02 DIAGNOSIS — Z792 Long term (current) use of antibiotics: Secondary | ICD-10-CM | POA: Insufficient documentation

## 2013-11-02 DIAGNOSIS — M1710 Unilateral primary osteoarthritis, unspecified knee: Secondary | ICD-10-CM | POA: Insufficient documentation

## 2013-11-02 DIAGNOSIS — F17200 Nicotine dependence, unspecified, uncomplicated: Secondary | ICD-10-CM | POA: Insufficient documentation

## 2013-11-02 DIAGNOSIS — Z79899 Other long term (current) drug therapy: Secondary | ICD-10-CM | POA: Insufficient documentation

## 2013-11-02 DIAGNOSIS — IMO0002 Reserved for concepts with insufficient information to code with codable children: Principal | ICD-10-CM

## 2013-11-02 MED ORDER — INDOMETHACIN 25 MG PO CAPS: 25.0000 mg | ORAL_CAPSULE | Freq: Three times a day (TID) | ORAL | Status: DC | PRN

## 2013-11-02 MED ORDER — HYDROCODONE-ACETAMINOPHEN 5-325 MG PO TABS: 1.0000 | ORAL_TABLET | ORAL | Status: DC | PRN

## 2013-11-02 NOTE — ED Provider Notes (Signed)
CSN: [id number]     Arrival date & time 11/02/13  1445 History  This chart was scribed for non-physician practitioner Wille Glaser, PA-C working with Merryl Hacker, MD by Anastasia Pall, ED scribe. This patient was seen in room WTR7/WTR7 and the patient's care was started at [facility] PM.   Chief Complaint  Patient presents with  . Knee Pain   (Consider location/radiation/quality/duration/timing/severity/associated sxs/prior Treatment) The history is provided by the patient. No language interpreter was used.   HPI Comments: Veronica Spence is a 63 y.o. female who presents to the Emergency Department complaining of constant, right knee pain, with associated swelling, onset 5 days ago. She denies any known trauma to her knee. She reports there is tendon/muscle tightness over the back of her right knee. She reports that extending her right LE exacerbates her tightness, but does not exacerbate her pain. She denies left knee pain, calf pain, fever, chills, and any other associated symptoms. She denies h/o arthritis, gout.   PCP - No primary provider on file.  Past Medical History  Diagnosis Date  . Hypercholesteremia   . Diverticulitis    Past Surgical History  Procedure Laterality Date  . Abdominal hysterectomy     No family history on file. History  Substance Use Topics  . Smoking status: Current Every Day Smoker  . Smokeless tobacco: Not on file  . Alcohol Use: No   OB History   Grav Para Term Preterm Abortions TAB SAB Ect Mult Living                 Review of Systems  All other systems reviewed and are negative.   Allergies  Tetracyclines & related  Home Medications   Current Outpatient Rx  Name  Route  Sig  Dispense  Refill  . aspirin EC 81 MG tablet   Oral   Take 81 mg by mouth daily.         Marland Kitchen ezetimibe-simvastatin (VYTORIN) 10-20 MG per tablet   Oral   Take 1 tablet by mouth daily.         Marland Kitchen HYDROcodone-acetaminophen (NORCO/VICODIN) 5-325 MG per tablet  Oral   Take 1-2 tablets by mouth every 6 (six) hours as needed for severe pain.   15 tablet   0   . ibuprofen (ADVIL,MOTRIN) 800 MG tablet   Oral   Take 800 mg by mouth every 8 (eight) hours as needed (pain).         . Multiple Vitamin (MULTIVITAMIN WITH MINERALS) TABS   Oral   Take 1 tablet by mouth daily.         . penicillin v potassium (VEETID) 500 MG tablet   Oral   Take 1 tablet (500 mg total) by mouth 4 (four) times daily.   40 tablet   0    BP 153/66  Pulse 69  Resp 18  SpO2 99%  Physical Exam  Nursing note and vitals reviewed. Constitutional: She appears well-developed and well-nourished. No distress.  HENT:  Head: Normocephalic and atraumatic.  Mouth/Throat: Oropharynx is clear and moist.  Eyes: Conjunctivae are normal. No scleral icterus.  Pulmonary/Chest: Effort normal.  Musculoskeletal:       Right knee: She exhibits swelling and effusion. She exhibits normal range of motion, no ecchymosis, no erythema, no LCL laxity, no bony tenderness and no MCL laxity. Tenderness found. Medial joint line tenderness noted. No patellar tendon tenderness noted.       Left knee: She exhibits effusion. She exhibits normal  range of motion, no swelling, no LCL laxity, no bony tenderness and no MCL laxity. No tenderness found. No medial joint line and no lateral joint line tenderness noted.       Legs:   ED Course  Procedures (including critical care time)  DIAGNOSTIC STUDIES: Oxygen Saturation is 99% on room air, normal by my interpretation.    COORDINATION OF CARE: 3:28 PM-Discussed treatment plan which includes right knee X-ray with pt at bedside and pt agreed to plan.   Dg Knee Complete 4 Views Right  11/02/2013   CLINICAL DATA:  Right knee pain and swelling, no known injury  EXAM: RIGHT KNEE - COMPLETE 4+ VIEW  COMPARISON:  None.  FINDINGS: No acute fracture or malalignment. Small suprapatellar joint effusion. Mild fullness in the soft tissues posterior to the knee  joint. Query Baker's cyst. Normal bony mineralization. No lytic or blastic osseous lesion. Mild degenerative osteoarthritis most notable in the patellofemoral compartment. There is some narrowing in the medial joint space as well.  IMPRESSION: 1. Small suprapatellar joint effusion. 2. Tricompartmental degenerative changes most notable in the patellofemoral and medial compartments. 3. Nonspecific soft tissue fullness posterior to the knee joint. Query Baker's cyst?   Electronically Signed   By: Jacqulynn Cadet M.D.   On: 11/02/2013 17:29     Medications - No data to display  MDM  Arthritis Baker's cyst  Patient here with right knee pain and fullness to the back of the knee, she denies any calf tenderness, pain, redness, I do not suspect DVT at this time, no cording noted.  Will place in knee sleeve, pain medication and ortho follow up.  I personally performed the services described in this documentation, which was scribed in my presence. The recorded information has been reviewed and is accurate.    Idalia Needle Joelyn Oms, PA-C 11/02/13 1751

## 2013-11-02 NOTE — Discharge Instructions (Signed)
Arthritis, Nonspecific °Arthritis is inflammation of a joint. This usually means pain, redness, warmth or swelling are present. One or more joints may be involved. There are a number of types of arthritis. Your caregiver may not be able to tell what type of arthritis you have right away. °CAUSES  °The most common cause of arthritis is the wear and tear on the joint (osteoarthritis). This causes damage to the cartilage, which can break down over time. The knees, hips, back and neck are most often affected by this type of arthritis. °Other types of arthritis and common causes of joint pain include: °· Sprains and other injuries near the joint. Sometimes minor sprains and injuries cause pain and swelling that develop hours later. °· Rheumatoid arthritis. This affects hands, feet and knees. It usually affects both sides of your body at the same time. It is often associated with chronic ailments, fever, weight loss and general weakness. °· Crystal arthritis. Gout and pseudo gout can cause occasional acute severe pain, redness and swelling in the foot, ankle, or knee. °· Infectious arthritis. Bacteria can get into a joint through a break in overlying skin. This can cause infection of the joint. Bacteria and viruses can also spread through the blood and affect your joints. °· Drug, infectious and allergy reactions. Sometimes joints can become mildly painful and slightly swollen with these types of illnesses. °SYMPTOMS  °· Pain is the main symptom. °· Your joint or joints can also be red, swollen and warm or hot to the touch. °· You may have a fever with certain types of arthritis, or even feel overall ill. °· The joint with arthritis will hurt with movement. Stiffness is present with some types of arthritis. °DIAGNOSIS  °Your caregiver will suspect arthritis based on your description of your symptoms and on your exam. Testing may be needed to find the type of arthritis: °· Blood and sometimes urine tests. °· X-ray tests  and sometimes CT or MRI scans. °· Removal of fluid from the joint (arthrocentesis) is done to check for bacteria, crystals or other causes. Your caregiver (or a specialist) will numb the area over the joint with a local anesthetic, and use a needle to remove joint fluid for examination. This procedure is only minimally uncomfortable. °· Even with these tests, your caregiver may not be able to tell what kind of arthritis you have. Consultation with a specialist (rheumatologist) may be helpful. °TREATMENT  °Your caregiver will discuss with you treatment specific to your type of arthritis. If the specific type cannot be determined, then the following general recommendations may apply. °Treatment of severe joint pain includes: °· Rest. °· Elevation. °· Anti-inflammatory medication (for example, ibuprofen) may be prescribed. Avoiding activities that cause increased pain. °· Only take over-the-counter or prescription medicines for pain and discomfort as recommended by your caregiver. °· Cold packs over an inflamed joint may be used for 10 to 15 minutes every hour. Hot packs sometimes feel better, but do not use overnight. Do not use hot packs if you are diabetic without your caregiver's permission. °· A cortisone shot into arthritic joints may help reduce pain and swelling. °· Any acute arthritis that gets worse over the next 1 to 2 days needs to be looked at to be sure there is no joint infection. °Long-term arthritis treatment involves modifying activities and lifestyle to reduce joint stress jarring. This can include weight loss. Also, exercise is needed to nourish the joint cartilage and remove waste. This helps keep the muscles   around the joint strong. HOME CARE INSTRUCTIONS   Do not take aspirin to relieve pain if gout is suspected. This elevates uric acid levels.  Only take over-the-counter or prescription medicines for pain, discomfort or fever as directed by your caregiver.  Rest the joint as much as  possible.  If your joint is swollen, keep it elevated.  Use crutches if the painful joint is in your leg.  Drinking plenty of fluids may help for certain types of arthritis.  Follow your caregiver's dietary instructions.  Try low-impact exercise such as:  Swimming.  Water aerobics.  Biking.  Walking.  Morning stiffness is often relieved by a warm shower.  Put your joints through regular range-of-motion. SEEK MEDICAL CARE IF:   You do not feel better in 24 hours or are getting worse.  You have side effects to medications, or are not getting better with treatment. SEEK IMMEDIATE MEDICAL CARE IF:   You have a fever.  You develop severe joint pain, swelling or redness.  Many joints are involved and become painful and swollen.  There is severe back pain and/or leg weakness.  You have loss of bowel or bladder control. Document Released: 10/23/2004 Document Revised: 12/08/2011 Document Reviewed: 11/08/2008 [facility] Patient Information 2014 [place].  Baker Cyst A Baker cyst is a sac-like structure that forms in the back of the knee. It is filled with the same fluid that is located in your knee. This fluid lubricates the bones and cartilage of the knee and allows them to move over each other more easily. CAUSES  When the knee becomes injured or inflamed, increased fluid forms in the knee. When this happens, the joint lining is pushed out behind the knee and forms the Baker cyst. This cyst may also be caused by inflammation from arthritic conditions and infections. SIGNS AND SYMPTOMS  A Baker cyst usually has no symptoms. When the cyst is substantially enlarged:  You may feel pressure behind the knee, stiffness in the knee, or a mass in the area behind the knee.  You may develop pain, redness, and swelling in the calf. This can suggest a blood clot and requires evaluation by your health care provider. DIAGNOSIS  A Baker cyst is most often found during an ultrasound  exam. This exam may have been performed for other reasons, and the cyst was found incidentally. Sometimes an MRI is used. This picks up other problems within a joint that an ultrasound exam may not. If the Baker cyst developed immediately after an injury, X-ray exams may be used to diagnose the cyst. TREATMENT  The treatment depends on the cause of the cyst. Anti-inflammatory medicines and rest often will be prescribed. If the cyst is caused by a bacterial infection, antibiotic medicines may be prescribed.  HOME CARE INSTRUCTIONS   If the cyst was caused by an injury, for the first 24 hours, keep the injured leg elevated on 2 pillows while lying down.  For the first 24 hours while you are awake, apply ice to the injured area:  Put ice in a plastic bag.  Place a towel between your skin and the bag.  Leave the ice on for 20 minutes, 2 3 times a day.  Only take over-the-counter or prescription medicines for pain, discomfort, or fever as directed by your health care provider.  Only take antibiotic medicine as directed. Make sure to finish it even if you start to feel better. MAKE SURE YOU:   Understand these instructions.  Will watch your  condition.  Will get help right away if you are not doing well or get worse. Document Released: 09/15/2005 Document Revised: 07/06/2013 Document Reviewed: 04/27/2013 [facility] Patient Information 2014 [place].

## 2013-11-02 NOTE — ED Notes (Signed)
Per pt states right knee pain and swelling since last Saturday-no injury

## 2013-11-03 NOTE — ED Provider Notes (Signed)
Medical screening examination/treatment/procedure(s) were performed by non-physician practitioner and as supervising physician I was immediately available for consultation/collaboration.  EKG Interpretation   None         Wandra Arthurs, MD 11/03/13 [phone]

## 2014-03-06 ENCOUNTER — Encounter (HOSPITAL_COMMUNITY): Payer: Self-pay | Admitting: Emergency Medicine

## 2014-03-06 ENCOUNTER — Emergency Department (HOSPITAL_COMMUNITY)
Admission: EM | Admit: 2014-03-06 | Discharge: 2014-03-06 | Disposition: A | Discharge status: Home / Self Care | Payer: No Typology Code available for payment source | Attending: Emergency Medicine | Admitting: Emergency Medicine

## 2014-03-06 DIAGNOSIS — M1710 Unilateral primary osteoarthritis, unspecified knee: Secondary | ICD-10-CM | POA: Insufficient documentation

## 2014-03-06 DIAGNOSIS — E7800 Pure hypercholesterolemia, unspecified: Secondary | ICD-10-CM | POA: Insufficient documentation

## 2014-03-06 DIAGNOSIS — Z7982 Long term (current) use of aspirin: Secondary | ICD-10-CM | POA: Insufficient documentation

## 2014-03-06 DIAGNOSIS — F17200 Nicotine dependence, unspecified, uncomplicated: Secondary | ICD-10-CM | POA: Insufficient documentation

## 2014-03-06 DIAGNOSIS — M1990 Unspecified osteoarthritis, unspecified site: Secondary | ICD-10-CM

## 2014-03-06 DIAGNOSIS — IMO0002 Reserved for concepts with insufficient information to code with codable children: Secondary | ICD-10-CM | POA: Insufficient documentation

## 2014-03-06 DIAGNOSIS — M25461 Effusion, right knee: Secondary | ICD-10-CM

## 2014-03-06 DIAGNOSIS — Z79899 Other long term (current) drug therapy: Secondary | ICD-10-CM | POA: Insufficient documentation

## 2014-03-06 DIAGNOSIS — Z8719 Personal history of other diseases of the digestive system: Secondary | ICD-10-CM | POA: Insufficient documentation

## 2014-03-06 MED ORDER — HYDROCODONE-ACETAMINOPHEN 5-325 MG PO TABS: 1.0000 | ORAL_TABLET | ORAL | Status: DC | PRN

## 2014-03-06 MED ORDER — INDOMETHACIN 25 MG PO CAPS: 25.0000 mg | ORAL_CAPSULE | Freq: Three times a day (TID) | ORAL | Status: DC | PRN

## 2014-03-06 NOTE — ED Notes (Signed)
Patient states she was suppose to be referred to an orthopaedic by [facility] but then was told she could not get an appointment until July. Patient is not taking any medications currently, but has tried [facility] arthritis in the past. Denies injury or trauma to the extremities.

## 2014-03-06 NOTE — ED Provider Notes (Signed)
CSN: [id number]     Arrival date & time 03/06/14  1600 History  This chart was scribed for non-physician practitioner, Carlisle Cater, PA-C, working with Ephraim Hamburger, MD by Roe Coombs, ED Scribe. This patient was seen in room WTR7/WTR7 and the patient's care was started at 5:37 PM.   Chief Complaint  Patient presents with  . Joint Swelling    The history is provided by the patient. No language interpreter was used.    HPI Comments: Veronica Spence is a 63 y.o. female who presents to the Emergency Department complaining of constant right knee pain that began a few days ago. Pain is worse with certain movements. Patient is able to ambulate without significant difficulty. There is associated joint swelling and warmth to the knee. There is no redness. She denies nausea, vomiting, fever or chills. Per medical records, she was seen for the same problem in February and had an x-ray that showed suprapatellar joint effusion, arthritis and possible Baker's cysts. She was also advised to follow up with ortho. Patient reports today  that her symptoms improved after using Norco and indomethacin prescribed during her February ED visit for a few weeks. She states that she is being seen at [facility], but she wasn't able to get an appointment for to follow up with ortho until July of this year through [facility] referral program. She has no history of hypertension or DM. Her medical history includes hypercholesteremia and diverticulitis.   Past Medical History  Diagnosis Date  . Hypercholesteremia   . Diverticulitis    Past Surgical History  Procedure Laterality Date  . Abdominal hysterectomy     No family history on file. History  Substance Use Topics  . Smoking status: Current Every Day Smoker  . Smokeless tobacco: Not on file  . Alcohol Use: No   OB History   Grav Para Term Preterm Abortions TAB SAB Ect Mult Living                 Review of Systems  Constitutional: Negative for fever  and chills.  Gastrointestinal: Negative for nausea and vomiting.  Musculoskeletal: Positive for arthralgias (right knee) and joint swelling (right knee).  Skin: Negative for color change.    Allergies  Tetracyclines & related  Home Medications   Prior to Admission medications   Medication Sig Start Date End Date Taking? Authorizing Provider  aspirin EC 81 MG tablet Take 81 mg by mouth daily.    Historical Provider, MD  Aspirin-Salicylamide-Caffeine (BC HEADACHE POWDER PO) Take 1 packet by mouth once as needed (headache).    Historical Provider, MD  ezetimibe-simvastatin (VYTORIN) 10-20 MG per tablet Take 1 tablet by mouth daily.    Historical Provider, MD  HYDROcodone-acetaminophen (NORCO/VICODIN) 5-325 MG per tablet Take 1 tablet by mouth every 4 (four) hours as needed. 11/02/13   Idalia Needle. [place], PA-C  indomethacin (INDOCIN) 25 MG capsule Take 1 capsule (25 mg total) by mouth 3 (three) times daily as needed. 11/02/13   Idalia Needle. [place]ford, PA-C  Multiple Vitamin (MULTIVITAMIN WITH MINERALS) TABS Take 1 tablet by mouth daily.    Historical Provider, MD  pantoprazole (PROTONIX) 40 MG tablet Take 40 mg by mouth daily as needed (acid reflux).    Historical Provider, MD   Physical Exam  Nursing note and vitals reviewed. Constitutional: She is oriented to person, place, and time. She appears well-developed and well-nourished. No distress.  HENT:  Head: Normocephalic and atraumatic.  Eyes: Conjunctivae and EOM are  normal.  Neck: Normal range of motion. No tracheal deviation present.  Cardiovascular: Normal rate.   Pulses:      Dorsalis pedis pulses are 2+ on the right side, and 2+ on the left side.  Pulmonary/Chest: Effort normal. No respiratory distress.  Musculoskeletal: Normal range of motion.       Right knee: She exhibits effusion. She exhibits no erythema.       Right lower leg: She exhibits no tenderness, no swelling and no edema.       Left lower leg: She exhibits no tenderness,  no swelling and no edema.  Right knee effusion. No overlying redness. Normal ROM of the knee. No swelling or tenderness in the calf. Normal pedal pulses.  Neurological: She is alert and oriented to person, place, and time.  Skin: Skin is warm and dry.  Psychiatric: She has a normal mood and affect. Her behavior is normal.    ED Course  Procedures (including critical care time) DIAGNOSTIC STUDIES: Oxygen Saturation is 99% on room air, normal by my interpretation.    COORDINATION OF CARE: 5:44 PM- Patient informed of current plan for treatment and evaluation and agrees with plan at this time.  BP 132/77  Pulse 89  Temp(Src) 98.4 F (36.9 C) (Oral)  Resp 20  SpO2 99%  Will treat patient with pain medicine and anti-inflammatories as this worked well for her last time. Strongly encourage orthopedic followup. Referral given today.  Patient counseled on use of narcotic pain medications. Counseled not to combine these medications with others containing tylenol. Urged not to drink alcohol, drive, or perform any other activities that requires focus while taking these medications. The patient verbalizes understanding and agrees with the plan.   MDM   Final diagnoses:  Effusion of right knee  Osteoarthritis   Patient with right knee pain and effusion consistent with arthritis with associated effusion. I do not suspect septic joint given lack of fever, risk factors, patient is ambulatory with good range of motion. Patient did well last time with Vicodin and indomethacin. Will repeat this. Patient strongly encouraged to follow up with an orthopedist for long-term management of her osteoarthritis.  I personally performed the services described in this documentation, which was scribed in my presence. The recorded information has been reviewed and is accurate.    Carlisle Cater, PA-C 03/06/14 1805

## 2014-03-06 NOTE — ED Notes (Signed)
Pt presents with c/o right knee pain since February of this year. Pt says she was seen in February for same and they diagnosed with a cyst and said there was some fluid on her knee. Also received a diagnosis of arthritis at that time. Pt says her knee is very painful at this time. Ambulatory to triage.

## 2014-03-06 NOTE — Discharge Instructions (Signed)
Please read and follow all provided instructions.  Your diagnoses today include:  1. Effusion of right knee   2. Osteoarthritis     Tests performed today include:  Vital signs. See below for your results today.   Medications prescribed:   Vicodin (hydrocodone/acetaminophen) - narcotic pain medication  DO NOT drive or perform any activities that require you to be awake and alert because this medicine can make you drowsy. BE VERY CAREFUL not to take multiple medicines containing Tylenol (also called acetaminophen). Doing so can lead to an overdose which can damage your liver and cause liver failure and possibly death.   Indomethacin - anti-inflammatory pain medication  You have been prescribed an anti-inflammatory medication or NSAID. Take with food. Take smallest effective dose for the shortest duration needed for your pain. Stop taking if you experience stomach pain or vomiting.   Take any prescribed medications only as directed.  Home care instructions:   Follow any educational materials contained in this packet  Follow R.I.C.E. Protocol:  R - rest your injury   I  - use ice on injury without applying directly to skin  C - compress injury with bandage or splint  E - elevate the injury as much as possible  Follow-up instructions: Please follow-up with your primary care provider or the provided orthopedic physician (bone specialist) if you continue to have significant pain or trouble walking in 1 week. In this case you may have a severe injury that requires further care.   If you do not have a primary care doctor -- see below for referral information.   Return instructions:   Please return if your toes are numb or tingling, appear gray or blue, or you have severe pain (also elevate leg and loosen splint or wrap if you were given one)  Please return to the Emergency Department if you experience worsening symptoms.   Please return if you have any other emergent  concerns.  Additional Information:  Your vital signs today were: BP 132/77   Pulse 89   Temp(Src) 98.4 F (36.9 C) (Oral)   Resp 20   SpO2 99% If your blood pressure (BP) was elevated above 135/85 this visit, please have this repeated by your doctor within one month. --------------

## 2014-03-07 NOTE — ED Provider Notes (Signed)
Medical screening examination/treatment/procedure(s) were performed by non-physician practitioner and as supervising physician I was immediately available for consultation/collaboration.   EKG Interpretation None        Ephraim Hamburger, MD 03/07/14 [phone]

## 2014-04-24 ENCOUNTER — Other Ambulatory Visit (HOSPITAL_COMMUNITY): Payer: Self-pay | Admitting: Nurse Practitioner

## 2014-04-24 DIAGNOSIS — Z1231 Encounter for screening mammogram for malignant neoplasm of breast: Secondary | ICD-10-CM

## 2014-05-29 ENCOUNTER — Ambulatory Visit (HOSPITAL_COMMUNITY)
Admission: RE | Admit: 2014-05-29 | Discharge: 2014-05-29 | Disposition: A | Discharge status: Home / Self Care | Payer: No Typology Code available for payment source | Source: Ambulatory Visit | Attending: Nurse Practitioner | Admitting: Nurse Practitioner

## 2014-05-29 DIAGNOSIS — Z1231 Encounter for screening mammogram for malignant neoplasm of breast: Secondary | ICD-10-CM

## 2015-05-11 ENCOUNTER — Other Ambulatory Visit (HOSPITAL_COMMUNITY): Payer: Self-pay | Admitting: Internal Medicine

## 2015-05-11 DIAGNOSIS — Z1231 Encounter for screening mammogram for malignant neoplasm of breast: Secondary | ICD-10-CM

## 2015-05-31 ENCOUNTER — Ambulatory Visit (HOSPITAL_COMMUNITY)
Admission: RE | Admit: 2015-05-31 | Discharge: 2015-05-31 | Disposition: A | Discharge status: Home / Self Care | Payer: PPO | Source: Ambulatory Visit | Attending: Internal Medicine | Admitting: Internal Medicine

## 2015-05-31 DIAGNOSIS — Z1231 Encounter for screening mammogram for malignant neoplasm of breast: Secondary | ICD-10-CM

## 2015-07-24 ENCOUNTER — Encounter: Payer: Self-pay | Admitting: Gastroenterology

## 2015-12-07 ENCOUNTER — Emergency Department (HOSPITAL_COMMUNITY): Payer: PPO

## 2015-12-07 ENCOUNTER — Encounter (HOSPITAL_COMMUNITY): Payer: Self-pay | Admitting: Emergency Medicine

## 2015-12-07 ENCOUNTER — Emergency Department (HOSPITAL_COMMUNITY)
Admission: EM | Admit: 2015-12-07 | Discharge: 2015-12-07 | Disposition: A | Discharge status: Home / Self Care | Payer: PPO | Attending: Emergency Medicine | Admitting: Emergency Medicine

## 2015-12-07 DIAGNOSIS — E7800 Pure hypercholesterolemia, unspecified: Secondary | ICD-10-CM | POA: Insufficient documentation

## 2015-12-07 DIAGNOSIS — F17200 Nicotine dependence, unspecified, uncomplicated: Secondary | ICD-10-CM | POA: Insufficient documentation

## 2015-12-07 DIAGNOSIS — Z8719 Personal history of other diseases of the digestive system: Secondary | ICD-10-CM | POA: Diagnosis not present

## 2015-12-07 DIAGNOSIS — Z79899 Other long term (current) drug therapy: Secondary | ICD-10-CM | POA: Diagnosis not present

## 2015-12-07 DIAGNOSIS — R05 Cough: Secondary | ICD-10-CM | POA: Diagnosis present

## 2015-12-07 DIAGNOSIS — M5416 Radiculopathy, lumbar region: Secondary | ICD-10-CM

## 2015-12-07 DIAGNOSIS — Z7982 Long term (current) use of aspirin: Secondary | ICD-10-CM | POA: Insufficient documentation

## 2015-12-07 DIAGNOSIS — J069 Acute upper respiratory infection, unspecified: Secondary | ICD-10-CM | POA: Insufficient documentation

## 2015-12-07 LAB — URINE MICROSCOPIC-ADD ON

## 2015-12-07 LAB — URINALYSIS, ROUTINE W REFLEX MICROSCOPIC
Glucose, UA: NEGATIVE mg/dL
Ketones, ur: NEGATIVE mg/dL
LEUKOCYTES UA: NEGATIVE
Nitrite: NEGATIVE
PH: 6 (ref 5.0–8.0)
Protein, ur: NEGATIVE mg/dL
Specific Gravity, Urine: 1.027 (ref 1.005–1.030)

## 2015-12-07 MED ORDER — METHOCARBAMOL 500 MG PO TABS: 500.0000 mg | ORAL_TABLET | Freq: Two times a day (BID) | ORAL | Status: DC

## 2015-12-07 MED ORDER — BENZONATATE 100 MG PO CAPS: 100.0000 mg | ORAL_CAPSULE | Freq: Three times a day (TID) | ORAL | Status: DC | PRN

## 2015-12-07 NOTE — ED Notes (Signed)
Bed: WA27 Expected date:  Expected time:  Means of arrival:  Comments: 

## 2015-12-07 NOTE — Discharge Instructions (Signed)
Take acetaminophen (Tylenol) up to 975 mg (this is normally 3 over-the-counter pills) up to 3 times a day. Do not drink alcohol. Make sure your other medications do not contain acetaminophen (Read the labels!)  For pain control you may take up to 800mg  of Motrin (also known as ibuprofen). That is usually 4 over the counter pills,  3 times a day. Take with food to minimize stomach irritation   You can also take  tylenol (acetaminophen) 975mg  (this is 3 over the counter pills) four times a day. Do not drink alcohol or combine with other medications that have acetaminophen as an ingredient (Read the labels!).    For breakthrough pain you may take Robaxin. Do not drink alcohol, drive or operate heavy machinery when taking Robaxin.  Do not hesitate to return to the emergency room for any new, worsening or concerning symptoms.  Please obtain primary care using resource guide below. Let them know that you were seen in the emergency room and that they will need to obtain records for further outpatient management.    [employer] 211 is a great source of information about community services available.  Access by dialing 2-1-1 from anywhere in [place], or by website -  CustodianSupply.fi.   Other Local Resources (Updated 09/2015)  [place]    Phone Number and Address  [place] medical care - 1st and 3rd Saturday of every month  Must not qualify for public or private insurance and must have limited income [phone] [address], [place]  Child care  Emergency assistance for housing and [employer]  Medicaid [phone] [address], [place] [address]   [facility]  Low-cost medical care for children, communicable diseases, sexually-transmitted diseases, immunizations,  maternity care, womens health and family planning [phone] [address], [place] [address]  [facility]   Medication assistance for [facility] residents  Must meet income requirements [phone] [place], [place].    [place]  Child care  Emergency assistance for housing and [employer]  Medicaid [phone] [address] [place], [place] [address]  [facility] [place]   Low-cost medical care,   Monday through Friday, 9 am to 6 pm.   Accepts Medicare/Medicaid, and self-pay [phone] [address] [place], [place] [address]  [facility] for [place] care - Monday through Friday, 8:30 am - 5:30 pm  Accepts Medicaid and self-pay [phone] [address], [place], [place] [address]   [facility]  Primary medical care, including for those with sickle cell disease  Accepts Medicare, Medicaid, insurance and self-pay [phone] [place], [place]  [facility]   Primary medical care  Accepts Medicare, [place], insurance and self-pay [phone] [address] [place], [place] [address]   [facility] Department of Social Services  Child care  Emergency assistance for housing and [employer]  Medicaid [phone] [address] [place], [place] [address]  [place] Department of Health and [employer]  Child care  Emergency assistance for housing and [employer]  Medicaid [phone] [place],  [address]   [facility] Medication Assistance Program  Medication assistance for [employer]  [place] residents with no insurance only  Must have a primary care doctor [phone] [address], [place], [place]  [facility]    Primary medical care  [place], [place], insurance  [phone] [place], [place]  [facility]   Medication assistance [phone]  [facility]   Primary medical care  Accepts Medicare, [place], insurance and self-pay [phone] [address], [place] [address]  [facility]   Primary medical care  Accepts Medicare, [place], insurance and self-pay [phone] [address], [place] [address]  [facility]  For [place] residents between the ages of 48 and 61 who do not have any form of health insurance, Medicare, [place], or [place] benefits.  Services are provided free of charge to uninsured patients who fall within federal poverty guidelines.    Hours: Tuesdays and Thursdays, 4:15 - 8 pm [phone] [address] [place], [place] [address]  [facility]     Primary medical care  Dental care  Nutritional counseling  Pharmacy  Accepts Medicaid, Medicare, most insurance.  Fees are adjusted based on ability to pay.   [place], [place] [address] [place] [place], [place], [place] [facility], [place], [place] [facility] [place], [place]  [facility]  Womens health and family planning [phone] [place]. [place], [place] care  Emergency assistance for housing and [employer]  Medicaid [phone] [address] [place], [place] [address]   [facility]    Ages 55 and older  Hours: Mondays and Thursdays, 7:00 am - 9:00 am Patients are seen on a first come,  first served basis. [phone], ext. [place], [place]  Child care  Emergency assistance for housing and [employer]  Medicaid [phone] [address], [place] [address]  [facility] [place]  Medication assistance  Rental assistance  Food pantry  Medication assistance  Housing assistance  Emergency food distribution  Utility assistance [place], [place]  [address]. [place],  [address] Hours: Tuesdays and Thursdays from 9am - 12 noon by appointment only  [place],  [address]  [facility] [place] private insurance, [place], and [place].  Payment is based on a sliding scale for those without insurance.  Hours: Mondays, Tuesdays and Thursdays, 8:30 am - 5:30 pm.   [phone] [place], [place]  [facility] - Family Medicine at [facility], [place], and [place].  Payment is based on a sliding scale for those without insurance. [phone] [address] [place], [place]  [facility] - Pediatrics at [address], research (medical), [employer], and [place].  Payment is based on a sliding scale for those without insurance [phone] [address], [employer], [place]  [facility] - Pediatrics at [employer], [place], and [place].  Payment is based on a sliding scale for those without insurance. [place]  [place], [place]  [facility] - Pediatrics at [facility], [place], and [place].  Payment is based on a sliding scale for those without insurance. [phone], ext. [phone] [place], [place].     [place] care.  Accepts Medicaid and self-pay. [place], [place]

## 2015-12-07 NOTE — ED Notes (Signed)
Pt reports lower back pain radiating down her legs since Tuesday. Has been diagnosed with arthritis in her spine. Pt has also had a non-productive cough and sneezing. No sob or other URI symptoms.

## 2015-12-07 NOTE — ED Provider Notes (Signed)
CSN: [id number]     Arrival date & time 12/07/15  [id number] History   First MD Initiated Contact with Patient 12/07/15 1013     Chief Complaint  Patient presents with  . Back Pain  . Cough     (Consider location/radiation/quality/duration/timing/severity/associated sxs/prior Treatment) HPI  Blood pressure 125/71, pulse 90, temperature 98 F (36.7 C), temperature source Oral, resp. rate 14, height 5\' 6"  (1.676 m), weight 87.091 kg, SpO2 94 %.  Veronica Spence is a 65 y.o. female complaining of low back pain, rated 7 out of 10, radiating down the posterior both legs past the knee and into the calf, exacerbated by movement and palpation, alleviated with Tylenol worsening over the course of 3 days. Patient also notes a dry cough, sneezing. States she can taste the mucus. She denies fevers, chills, chest pain, shortness of breath, nausea, vomiting abdominal pain, change in bowel movements. With respect to urination, patient states her urine is a odd color and she has urinary urgency with no dysuria or hematuria. Patient denies history of cancer, history of IV drug use, saddle anesthesia, incontinence.  Past Medical History  Diagnosis Date  . Hypercholesteremia   . Diverticulitis    Past Surgical History  Procedure Laterality Date  . Abdominal hysterectomy     History reviewed. No pertinent family history. Social History  Substance Use Topics  . Smoking status: Current Every Day Smoker  . Smokeless tobacco: None  . Alcohol Use: No   OB History    No data available     Review of Systems  10 systems reviewed and found to be negative, except as noted in the HPI.   Allergies  Tetracyclines & related  Home Medications   Prior to Admission medications   Medication Sig Start Date End Date Taking? Authorizing Provider  aspirin EC 81 MG tablet Take 81 mg by mouth daily.    Historical Provider, MD  Aspirin-Salicylamide-Caffeine (BC HEADACHE POWDER PO) Take 1 packet by mouth once as  needed (headache).    Historical Provider, MD  benzonatate (TESSALON) 100 MG capsule Take 1 capsule (100 mg total) by mouth 3 (three) times daily as needed for cough. 12/07/15   Linden Tagliaferro, PA-C  ezetimibe-simvastatin (VYTORIN) 10-20 MG per tablet Take 1 tablet by mouth daily.    Historical Provider, MD  HYDROcodone-acetaminophen (NORCO/VICODIN) 5-325 MG per tablet Take 1 tablet by mouth every 4 (four) hours as needed. 03/06/14   Carlisle Cater, PA-C  indomethacin (INDOCIN) 25 MG capsule Take 1 capsule (25 mg total) by mouth 3 (three) times daily as needed. 03/06/14   Carlisle Cater, PA-C  methocarbamol (ROBAXIN) 500 MG tablet Take 1 tablet (500 mg total) by mouth 2 (two) times daily. 12/07/15   Shenetta Schnackenberg, PA-C  Multiple Vitamin (MULTIVITAMIN WITH MINERALS) TABS Take 1 tablet by mouth daily.    Historical Provider, MD  pantoprazole (PROTONIX) 40 MG tablet Take 40 mg by mouth daily as needed (acid reflux).    Historical Provider, MD   BP 128/65 mmHg  Pulse 70  Temp(Src) 98 F (36.7 C) (Oral)  Resp 14  Ht 5\' 6"  (1.676 m)  Wt 87.091 kg  BMI 31.00 kg/m2  SpO2 99% Physical Exam  Constitutional: She is oriented to person, place, and time. She appears well-developed and well-nourished. No distress.  HENT:  Head: Normocephalic and atraumatic.  Mouth/Throat: Oropharynx is clear and moist.  Eyes: Conjunctivae and EOM are normal. Pupils are equal, round, and reactive to light.  Neck: Normal range of  motion.  Cardiovascular: Normal rate, regular rhythm and intact distal pulses.   Pulmonary/Chest: Effort normal and breath sounds normal. No stridor. No respiratory distress. She has no wheezes. She has no rales. She exhibits no tenderness.  Abdominal: Soft. Bowel sounds are normal. She exhibits no distension and no mass. There is no tenderness. There is no rebound and no guarding.  Genitourinary:  No CVA tenderness to percussion bilaterally  Musculoskeletal: Normal range of motion.   Neurological: She is alert and oriented to person, place, and time.  No point tenderness to percussion of lumbar spinal processes.  No TTP or paraspinal muscular spasm. Strength is 5 out of 5 to bilateral lower extremities at hip and knee; extensor hallucis longus 5 out of 5. Ankle strength 5 out of 5, no clonus, neurovascularly intact. No saddle anaesthesia.    Ambulates with a coordinated in nonantalgic gait   Skin: She is not diaphoretic.  Psychiatric: She has a normal mood and affect.  Nursing note and vitals reviewed.   ED Course  Procedures (including critical care time) Labs Review Labs Reviewed  URINALYSIS, ROUTINE W REFLEX MICROSCOPIC (NOT AT [facility]) - Abnormal; Notable for the following:    Color, Urine AMBER (*)    Hgb urine dipstick SMALL (*)    Bilirubin Urine SMALL (*)    All other components within normal limits  URINE MICROSCOPIC-ADD ON - Abnormal; Notable for the following:    Squamous Epithelial / LPF 0-5 (*)    Bacteria, UA RARE (*)    All other components within normal limits    Imaging Review Dg Chest 2 View  12/07/2015  CLINICAL DATA:  Progressive cough.  Mid back pain. EXAM: CHEST - 2 VIEW COMPARISON:  Two-view chest x-ray 05/05/2012 FINDINGS: The heart size is upper limits of normal. Atherosclerotic calcifications are present at the aortic arch. There is no edema or effusion to suggest failure. No focal airspace disease is present. IMPRESSION: Borderline cardiomegaly without failure. No acute cardiopulmonary disease. Electronically Signed   By: [place] Morelle M.D.   On: 12/07/2015 11:15   I have personally reviewed and evaluated these images and lab results as part of my medical decision-making.   EKG Interpretation None      MDM   Final diagnoses:  Lumbar radiculopathy, acute  URI (upper respiratory infection)   Filed Vitals:   12/07/15 0900 12/07/15 1248  BP: 125/71 128/65  Pulse: 90 70  Temp: 98 F (36.7 C)   TempSrc: Oral   Resp: 14    Height: 5\' 6"  (1.676 m)   Weight: 87.091 kg   SpO2: 94% 99%    Veronica Spence is 65 y.o. female presenting with multiple complaints including low back pain, productive cough and urinary urgency. AFVSS, LSCTA, CXR negtive, UA without signs of infection.  No neurological deficits and normal neuro exam.   No loss of bowel or bladder control.  No concern for cauda equina.  No fever, night sweats, weight loss, h/o cancer, IVDU.  RICE protocol and pain medicine indicated and discussed with patient.  Evaluation does not show pathology that would require ongoing emergent intervention or inpatient treatment. Pt is hemodynamically stable and mentating appropriately. Discussed findings and plan with patient/guardian, who agrees with care plan. All questions answered. Return precautions discussed and outpatient follow up given.   Discharge Medication List as of 12/07/2015 12:58 PM    START taking these medications   Details  benzonatate (TESSALON) 100 MG capsule Take 1 capsule (100 mg total) by  mouth 3 (three) times daily as needed for cough., Starting 12/07/2015, Until Discontinued, Print    methocarbamol (ROBAXIN) 500 MG tablet Take 1 tablet (500 mg total) by mouth 2 (two) times daily., Starting 12/07/2015, Until Discontinued, Print             Monico Blitz, PA-C 12/07/15 1308  Merrily Pew, MD 12/07/15 1501

## 2016-07-28 ENCOUNTER — Other Ambulatory Visit: Payer: Self-pay | Admitting: Internal Medicine

## 2016-07-28 ENCOUNTER — Other Ambulatory Visit: Payer: Self-pay | Admitting: Specialist

## 2016-07-28 DIAGNOSIS — Z1231 Encounter for screening mammogram for malignant neoplasm of breast: Secondary | ICD-10-CM

## 2016-08-08 ENCOUNTER — Ambulatory Visit
Admission: RE | Admit: 2016-08-08 | Discharge: 2016-08-08 | Disposition: A | Discharge status: Home / Self Care | Payer: PPO | Source: Ambulatory Visit | Attending: Specialist | Admitting: Specialist

## 2016-08-08 DIAGNOSIS — Z1231 Encounter for screening mammogram for malignant neoplasm of breast: Secondary | ICD-10-CM

## 2017-01-23 DIAGNOSIS — M5136 Other intervertebral disc degeneration, lumbar region: Secondary | ICD-10-CM | POA: Insufficient documentation

## 2017-01-23 DIAGNOSIS — G8929 Other chronic pain: Secondary | ICD-10-CM | POA: Insufficient documentation

## 2017-01-23 DIAGNOSIS — M549 Dorsalgia, unspecified: Secondary | ICD-10-CM

## 2017-01-23 DIAGNOSIS — M79622 Pain in left upper arm: Secondary | ICD-10-CM | POA: Insufficient documentation

## 2017-01-23 DIAGNOSIS — R079 Chest pain, unspecified: Secondary | ICD-10-CM | POA: Insufficient documentation

## 2017-01-23 DIAGNOSIS — E7800 Pure hypercholesterolemia, unspecified: Secondary | ICD-10-CM | POA: Insufficient documentation

## 2017-01-23 DIAGNOSIS — E785 Hyperlipidemia, unspecified: Secondary | ICD-10-CM | POA: Insufficient documentation
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
## 2017-01-26 ENCOUNTER — Encounter (INDEPENDENT_AMBULATORY_CARE_PROVIDER_SITE_OTHER): Payer: Self-pay

## 2017-01-26 ENCOUNTER — Ambulatory Visit (INDEPENDENT_AMBULATORY_CARE_PROVIDER_SITE_OTHER): Payer: Medicare Other | Admitting: Cardiovascular Disease

## 2017-01-26 ENCOUNTER — Ambulatory Visit
Admission: RE | Admit: 2017-01-26 | Discharge: 2017-01-26 | Disposition: A | Discharge status: Home / Self Care | Payer: Medicare Other | Source: Ambulatory Visit | Attending: Cardiovascular Disease | Admitting: Cardiovascular Disease

## 2017-01-26 ENCOUNTER — Encounter: Payer: Self-pay | Admitting: Cardiovascular Disease

## 2017-01-26 VITALS — BP 130/82 | HR 68 | Ht 66.0 in | Wt 186.0 lb

## 2017-01-26 DIAGNOSIS — R079 Chest pain, unspecified: Secondary | ICD-10-CM

## 2017-01-26 DIAGNOSIS — Z7689 Persons encountering health services in other specified circumstances: Secondary | ICD-10-CM | POA: Diagnosis not present

## 2017-01-26 NOTE — Patient Instructions (Addendum)
Medication Instructions:  Your physician recommends that you continue on your current medications as directed. Please refer to the Current Medication list given to you today.  Labwork: NONE  Testing/Procedures: A chest x-ray takes a picture of the organs and structures inside the chest, including the heart, lungs, and blood vessels. This test can show several things, including, whether the heart is enlarges; whether fluid is building up in the lungs; and whether pacemaker / defibrillator leads are still in place.  Your physician has requested that you have en exercise stress myoview. For further information please visit [email or web address]. Please follow instruction sheet, as given.  Follow-Up: Your physician wants you to follow-up in: 6 months with Dr. Johnsie Cancel. You will receive a reminder letter in the mail two months in advance. If you don't receive a letter, please call our office to schedule the follow-up appointment.   If you need a refill on your cardiac medications before your next appointment, please call your pharmacy.

## 2017-01-28 ENCOUNTER — Telehealth (HOSPITAL_COMMUNITY): Payer: Self-pay | Admitting: *Deleted

## 2017-01-28 NOTE — Telephone Encounter (Signed)
Patient given detailed instructions per Myocardial Perfusion Study Information Sheet for the test on 01/30/17. Patient notified to arrive 15 minutes early and that it is imperative to arrive on time for appointment to keep from having the test rescheduled.  If you need to cancel or reschedule your appointment, please call the office within 24 hours of your appointment. Failure to do so may result in a cancellation of your appointment, and a $50 no show fee. Patient verbalized understanding. Kirstie Peri

## 2017-01-30 ENCOUNTER — Ambulatory Visit (HOSPITAL_COMMUNITY): Payer: Medicare Other | Attending: Cardiology

## 2017-01-30 DIAGNOSIS — F17200 Nicotine dependence, unspecified, uncomplicated: Secondary | ICD-10-CM | POA: Insufficient documentation

## 2017-01-30 DIAGNOSIS — R079 Chest pain, unspecified: Secondary | ICD-10-CM | POA: Insufficient documentation

## 2017-01-30 LAB — MYOCARDIAL PERFUSION IMAGING
CHL CUP NUCLEAR SDS: 2
CHL CUP NUCLEAR SRS: 0
CHL CUP RESTING HR STRESS: 71 {beats}/min
LHR: 0.33
LV dias vol: 95 mL (ref 46–106)
LV sys vol: 38 mL
Peak HR: 116 {beats}/min
SSS: 2
TID: 1.12

## 2017-01-30 MED ORDER — TECHNETIUM TC 99M TETROFOSMIN IV KIT
10.5000 | PACK | Freq: Once | INTRAVENOUS | Status: AC | PRN
  Administered 2017-01-30: 10.5 via INTRAVENOUS
  Filled 2017-01-30: qty 11

## 2017-01-30 MED ORDER — TECHNETIUM TC 99M TETROFOSMIN IV KIT
32.6000 | PACK | Freq: Once | INTRAVENOUS | Status: AC | PRN
  Administered 2017-01-30: 32.6 via INTRAVENOUS
  Filled 2017-01-30: qty 33

## 2017-01-30 MED ORDER — REGADENOSON 0.4 MG/5ML IV SOLN
0.4000 mg | Freq: Once | INTRAVENOUS | Status: AC
  Administered 2017-01-30: 0.4 mg via INTRAVENOUS

## 2017-02-02 ENCOUNTER — Telehealth: Payer: Self-pay | Admitting: Cardiovascular Disease

## 2017-02-02 NOTE — Telephone Encounter (Signed)
New Message  Pt voiced wanting to speak with Pam.  Please f/u

## 2017-02-02 NOTE — Telephone Encounter (Signed)
Called patient with results of stress test and chest xray.

## 2017-05-08 DIAGNOSIS — Z79891 Long term (current) use of opiate analgesic: Secondary | ICD-10-CM | POA: Diagnosis not present

## 2017-05-08 DIAGNOSIS — G894 Chronic pain syndrome: Secondary | ICD-10-CM | POA: Diagnosis not present

## 2017-05-08 DIAGNOSIS — M545 Low back pain: Secondary | ICD-10-CM | POA: Diagnosis not present

## 2017-05-15 DIAGNOSIS — I10 Essential (primary) hypertension: Secondary | ICD-10-CM | POA: Diagnosis not present

## 2017-05-18 DIAGNOSIS — M791 Myalgia: Secondary | ICD-10-CM | POA: Diagnosis not present

## 2017-05-22 DIAGNOSIS — I10 Essential (primary) hypertension: Secondary | ICD-10-CM | POA: Diagnosis not present

## 2017-06-08 DIAGNOSIS — G894 Chronic pain syndrome: Secondary | ICD-10-CM | POA: Diagnosis not present

## 2017-06-08 DIAGNOSIS — Z79891 Long term (current) use of opiate analgesic: Secondary | ICD-10-CM | POA: Diagnosis not present

## 2017-06-08 DIAGNOSIS — M545 Low back pain: Secondary | ICD-10-CM | POA: Diagnosis not present

## 2017-06-18 DIAGNOSIS — M545 Low back pain: Secondary | ICD-10-CM | POA: Diagnosis not present

## 2017-06-18 DIAGNOSIS — M791 Myalgia: Secondary | ICD-10-CM | POA: Diagnosis not present

## 2017-07-03 ENCOUNTER — Other Ambulatory Visit: Payer: Self-pay | Admitting: Specialist

## 2017-07-03 DIAGNOSIS — Z1231 Encounter for screening mammogram for malignant neoplasm of breast: Secondary | ICD-10-CM

## 2017-07-06 DIAGNOSIS — Z79891 Long term (current) use of opiate analgesic: Secondary | ICD-10-CM | POA: Diagnosis not present

## 2017-07-06 DIAGNOSIS — M545 Low back pain: Secondary | ICD-10-CM | POA: Diagnosis not present

## 2017-07-06 DIAGNOSIS — G894 Chronic pain syndrome: Secondary | ICD-10-CM | POA: Diagnosis not present

## 2017-07-10 DIAGNOSIS — Z0000 Encounter for general adult medical examination without abnormal findings: Secondary | ICD-10-CM | POA: Diagnosis not present

## 2017-07-18 DIAGNOSIS — M545 Low back pain: Secondary | ICD-10-CM | POA: Diagnosis not present

## 2017-07-18 DIAGNOSIS — M791 Myalgia, unspecified site: Secondary | ICD-10-CM | POA: Diagnosis not present

## 2017-07-21 DIAGNOSIS — I10 Essential (primary) hypertension: Secondary | ICD-10-CM | POA: Diagnosis not present

## 2017-07-21 DIAGNOSIS — E785 Hyperlipidemia, unspecified: Secondary | ICD-10-CM | POA: Diagnosis not present

## 2017-07-21 DIAGNOSIS — M545 Low back pain: Secondary | ICD-10-CM | POA: Diagnosis not present

## 2017-07-31 DIAGNOSIS — H2513 Age-related nuclear cataract, bilateral: Secondary | ICD-10-CM | POA: Diagnosis not present

## 2017-08-05 DIAGNOSIS — I10 Essential (primary) hypertension: Secondary | ICD-10-CM | POA: Diagnosis not present

## 2017-08-05 DIAGNOSIS — M545 Low back pain: Secondary | ICD-10-CM | POA: Diagnosis not present

## 2017-08-05 DIAGNOSIS — G894 Chronic pain syndrome: Secondary | ICD-10-CM | POA: Diagnosis not present

## 2017-08-05 DIAGNOSIS — E785 Hyperlipidemia, unspecified: Secondary | ICD-10-CM | POA: Diagnosis not present

## 2017-08-05 DIAGNOSIS — Z79891 Long term (current) use of opiate analgesic: Secondary | ICD-10-CM | POA: Diagnosis not present

## 2017-08-05 DIAGNOSIS — R21 Rash and other nonspecific skin eruption: Secondary | ICD-10-CM | POA: Diagnosis not present

## 2017-08-12 ENCOUNTER — Ambulatory Visit
Admission: RE | Admit: 2017-08-12 | Discharge: 2017-08-12 | Disposition: A | Discharge status: Home / Self Care | Payer: Medicare Other | Source: Ambulatory Visit | Attending: Specialist | Admitting: Specialist

## 2017-08-12 DIAGNOSIS — Z1231 Encounter for screening mammogram for malignant neoplasm of breast: Secondary | ICD-10-CM | POA: Diagnosis not present

## 2017-08-18 DIAGNOSIS — M545 Low back pain: Secondary | ICD-10-CM | POA: Diagnosis not present

## 2017-08-18 DIAGNOSIS — M791 Myalgia, unspecified site: Secondary | ICD-10-CM | POA: Diagnosis not present

## 2017-08-21 DIAGNOSIS — L300 Nummular dermatitis: Secondary | ICD-10-CM | POA: Diagnosis not present

## 2017-09-04 DIAGNOSIS — L810 Postinflammatory hyperpigmentation: Secondary | ICD-10-CM | POA: Diagnosis not present

## 2017-09-04 DIAGNOSIS — L300 Nummular dermatitis: Secondary | ICD-10-CM | POA: Diagnosis not present

## 2017-09-04 DIAGNOSIS — L853 Xerosis cutis: Secondary | ICD-10-CM | POA: Diagnosis not present

## 2017-09-12 DIAGNOSIS — G894 Chronic pain syndrome: Secondary | ICD-10-CM | POA: Diagnosis not present

## 2017-09-12 DIAGNOSIS — M545 Low back pain: Secondary | ICD-10-CM | POA: Diagnosis not present

## 2017-09-12 DIAGNOSIS — Z79891 Long term (current) use of opiate analgesic: Secondary | ICD-10-CM | POA: Diagnosis not present

## 2017-09-17 DIAGNOSIS — M545 Low back pain: Secondary | ICD-10-CM | POA: Diagnosis not present

## 2017-09-17 DIAGNOSIS — M791 Myalgia, unspecified site: Secondary | ICD-10-CM | POA: Diagnosis not present

## 2017-10-02 DIAGNOSIS — L300 Nummular dermatitis: Secondary | ICD-10-CM | POA: Diagnosis not present

## 2017-10-02 DIAGNOSIS — L810 Postinflammatory hyperpigmentation: Secondary | ICD-10-CM | POA: Diagnosis not present

## 2017-10-13 DIAGNOSIS — Z79891 Long term (current) use of opiate analgesic: Secondary | ICD-10-CM | POA: Diagnosis not present

## 2017-10-13 DIAGNOSIS — G894 Chronic pain syndrome: Secondary | ICD-10-CM | POA: Diagnosis not present

## 2017-10-13 DIAGNOSIS — M545 Low back pain: Secondary | ICD-10-CM | POA: Diagnosis not present

## 2017-10-18 DIAGNOSIS — M545 Low back pain: Secondary | ICD-10-CM | POA: Diagnosis not present

## 2017-10-18 DIAGNOSIS — M791 Myalgia, unspecified site: Secondary | ICD-10-CM | POA: Diagnosis not present

## 2017-11-03 DIAGNOSIS — E118 Type 2 diabetes mellitus with unspecified complications: Secondary | ICD-10-CM | POA: Diagnosis not present

## 2017-11-03 DIAGNOSIS — J069 Acute upper respiratory infection, unspecified: Secondary | ICD-10-CM | POA: Diagnosis not present

## 2017-11-03 DIAGNOSIS — I10 Essential (primary) hypertension: Secondary | ICD-10-CM | POA: Diagnosis not present

## 2017-11-10 DIAGNOSIS — G894 Chronic pain syndrome: Secondary | ICD-10-CM | POA: Diagnosis not present

## 2017-11-10 DIAGNOSIS — M545 Low back pain: Secondary | ICD-10-CM | POA: Diagnosis not present

## 2017-11-10 DIAGNOSIS — Z79891 Long term (current) use of opiate analgesic: Secondary | ICD-10-CM | POA: Diagnosis not present

## 2017-12-05 ENCOUNTER — Encounter: Payer: Self-pay | Admitting: Gastroenterology

## 2017-12-09 DIAGNOSIS — Z79891 Long term (current) use of opiate analgesic: Secondary | ICD-10-CM | POA: Diagnosis not present

## 2017-12-09 DIAGNOSIS — G894 Chronic pain syndrome: Secondary | ICD-10-CM | POA: Diagnosis not present

## 2017-12-09 DIAGNOSIS — M545 Low back pain: Secondary | ICD-10-CM | POA: Diagnosis not present

## 2017-12-25 ENCOUNTER — Other Ambulatory Visit: Payer: Self-pay

## 2017-12-25 NOTE — Patient Outreach (Signed)
[facility]) Care Management  12/25/2017  Veronica Spence [birth year] [id number]   Medication Adherence call to Mrs. Veronica Spence patient is showing past due under [facility] Ins.on Vytorin 10/20 mg spoke with patient she said [place] deliver on 12/24/17 and she is back on track on this medication she also said she had order in advance last time and had some left from last time .  [place] Management Direct Dial [phone]  Fax [phone] [email or web address]

## 2018-01-06 DIAGNOSIS — Z79891 Long term (current) use of opiate analgesic: Secondary | ICD-10-CM | POA: Diagnosis not present

## 2018-01-06 DIAGNOSIS — G894 Chronic pain syndrome: Secondary | ICD-10-CM | POA: Diagnosis not present

## 2018-01-06 DIAGNOSIS — M545 Low back pain: Secondary | ICD-10-CM | POA: Diagnosis not present

## 2018-01-06 DIAGNOSIS — M5136 Other intervertebral disc degeneration, lumbar region: Secondary | ICD-10-CM | POA: Diagnosis not present

## 2018-02-05 DIAGNOSIS — G894 Chronic pain syndrome: Secondary | ICD-10-CM | POA: Diagnosis not present

## 2018-02-05 DIAGNOSIS — M5136 Other intervertebral disc degeneration, lumbar region: Secondary | ICD-10-CM | POA: Diagnosis not present

## 2018-02-05 DIAGNOSIS — M545 Low back pain: Secondary | ICD-10-CM | POA: Diagnosis not present

## 2018-02-05 DIAGNOSIS — Z79891 Long term (current) use of opiate analgesic: Secondary | ICD-10-CM | POA: Diagnosis not present

## 2018-02-24 ENCOUNTER — Other Ambulatory Visit (INDEPENDENT_AMBULATORY_CARE_PROVIDER_SITE_OTHER): Payer: Medicare Other

## 2018-02-24 ENCOUNTER — Ambulatory Visit (INDEPENDENT_AMBULATORY_CARE_PROVIDER_SITE_OTHER): Payer: Medicare Other | Admitting: Nurse Practitioner

## 2018-02-24 ENCOUNTER — Encounter: Payer: Self-pay | Admitting: Nurse Practitioner

## 2018-02-24 VITALS — BP 142/78 | HR 64 | Temp 98.5°F | Resp 16 | Ht 66.0 in | Wt 185.0 lb

## 2018-02-24 DIAGNOSIS — R7309 Other abnormal glucose: Secondary | ICD-10-CM | POA: Diagnosis not present

## 2018-02-24 DIAGNOSIS — Z1159 Encounter for screening for other viral diseases: Secondary | ICD-10-CM

## 2018-02-24 DIAGNOSIS — R030 Elevated blood-pressure reading, without diagnosis of hypertension: Secondary | ICD-10-CM

## 2018-02-24 DIAGNOSIS — E785 Hyperlipidemia, unspecified: Secondary | ICD-10-CM

## 2018-02-24 DIAGNOSIS — Z9189 Other specified personal risk factors, not elsewhere classified: Secondary | ICD-10-CM

## 2018-02-24 DIAGNOSIS — Z13820 Encounter for screening for osteoporosis: Secondary | ICD-10-CM | POA: Diagnosis not present

## 2018-02-24 DIAGNOSIS — Z0000 Encounter for general adult medical examination without abnormal findings: Secondary | ICD-10-CM | POA: Insufficient documentation

## 2018-02-24 DIAGNOSIS — Z122 Encounter for screening for malignant neoplasm of respiratory organs: Secondary | ICD-10-CM | POA: Diagnosis not present

## 2018-02-24 DIAGNOSIS — Z23 Encounter for immunization: Secondary | ICD-10-CM | POA: Diagnosis not present

## 2018-02-24 LAB — CBC
HCT: 39.5 % (ref 36.0–46.0)
Hemoglobin: 12.7 g/dL (ref 12.0–15.0)
MCHC: 32.3 g/dL (ref 30.0–36.0)
MCV: 78.7 fl (ref 78.0–100.0)
Platelets: 182 10*3/uL (ref 150.0–400.0)
RBC: 5.01 Mil/uL (ref 3.87–5.11)
RDW: 17 % — AB (ref 11.5–15.5)
WBC: 5.4 10*3/uL (ref 4.0–10.5)

## 2018-02-24 LAB — LIPID PANEL
Cholesterol: 162 mg/dL (ref 0–200)
HDL: 53.8 mg/dL (ref >39.00)
LDL Cholesterol: 92 mg/dL (ref 0–99)
NONHDL: 108.32
TRIGLYCERIDES: 80 mg/dL (ref 0.0–149.0)
Total CHOL/HDL Ratio: 3
VLDL: 16 mg/dL (ref 0.0–40.0)

## 2018-02-24 LAB — COMPREHENSIVE METABOLIC PANEL
ALK PHOS: 57 U/L (ref 39–117)
ALT: 14 U/L (ref 0–35)
AST: 17 U/L (ref 0–37)
Albumin: 4 g/dL (ref 3.5–5.2)
BILIRUBIN TOTAL: 0.4 mg/dL (ref 0.2–1.2)
BUN: 12 mg/dL (ref 6–23)
CO2: 29 mEq/L (ref 19–32)
Calcium: 9.3 mg/dL (ref 8.4–10.5)
Chloride: 108 mEq/L (ref 96–112)
Creatinine, Ser: 0.84 mg/dL (ref 0.40–1.20)
GFR: 86.73 mL/min (ref >60.00)
GLUCOSE: 88 mg/dL (ref 70–99)
Potassium: 4.2 mEq/L (ref 3.5–5.1)
SODIUM: 143 meq/L (ref 135–145)
TOTAL PROTEIN: 6.7 g/dL (ref 6.0–8.3)

## 2018-02-24 LAB — TSH: TSH: 0.6 u[IU]/mL (ref 0.35–4.50)

## 2018-02-24 LAB — HEMOGLOBIN A1C: HEMOGLOBIN A1C: 6.2 % (ref 4.6–6.5)

## 2018-02-24 NOTE — Assessment & Plan Note (Signed)
Continue current medications She is fasting today, update lipid panel F/U with further recommendations pending lab results - CBC; Future - Comprehensive metabolic panel; Future - TSH; Future - Lipid panel; Future

## 2018-02-24 NOTE — Progress Notes (Signed)
Name: Veronica Spence   MRN: [id number]    DOB: March 10, 1951   Date:02/24/2018       Progress Note  Subjective  Chief Complaint  Chief Complaint  Patient presents with  . Establish Care    CPE, fasting    HPI Veronica Spence is establishing care with me as a new patient to our practice today, she is transferring from another practice in [place], says she just wanted a change. She is also following routinely with pain management for chronic pain.Patient presents for annual CPE.  Diet, Exercise: walks some, no routine diet or exercise  USPSTF grade A and B recommendations  Depression:  Depression screen [facility] 2/9 02/24/2018  Decreased Interest 0  Down, Depressed, Hopeless 0  PHQ - 2 Score 0  Altered sleeping 0  Tired, decreased energy 0  Change in appetite 0  Feeling bad or failure about yourself  0  Trouble concentrating 0  Moving slowly or fidgety/restless 0  Suicidal thoughts 0  PHQ-9 Score 0   Hypertension: Reports history of elevated BP readings in clinical setting, but normal readings when she checks at home BP Readings from Last 3 Encounters:  02/24/18 (!) 142/78  01/26/17 130/82  12/07/15 128/65   Obesity: Wt Readings from Last 3 Encounters:  02/24/18 185 lb (83.9 kg)  01/26/17 186 lb (84.4 kg)  12/07/15 192 lb (87.1 kg)   BMI Readings from Last 3 Encounters:  02/24/18 29.86 kg/m  01/26/17 30.02 kg/m  12/07/15 30.99 kg/m    Alcohol: no Tobacco use: current smoker, thinking of quitting but she is not ready yet  HIV, hep C: will order Hep C screening today, declines HIV screening  STD testing and prevention (chl/gon/syphilis): not sexually active, no concerns Intimate partner violence:denies, feels safe  Menstrual History/LMP/Abnormal Bleeding: s/p total hysterectomy, no pelvic pain or vaginal bleeding  Incontinence Symptoms: denies  Vaccinations: will update TDAP and pneumonia today  Advanced Care Planning: A voluntary discussion about advance care  planning including the explanation and discussion of advance directives.  Discussed health care proxy and Living will, and the patient DOES NOT have a living will at present time. If patient does have living will, I have requested they bring this to the clinic to be scanned in to their chart.  Breast cancer: mammogram up to date  Osteoporosis: DEXA ordered today  Lipids: maintained on vytorin 10-20 once daily and aspirin 81mg  daily for past history of hyperlipidemia Reports daily medication compliance with no noted adverse effects, has been on vytorin for some time Denies history of heart attack or stroke  Lab Results  Component Value Date   CHOL 161 11/08/2009   CHOL 271 (H) 06/22/2008   Lab Results  Component Value Date   HDL 52 11/08/2009   HDL 51 06/22/2008   Lab Results  Component Value Date   LDLCALC 89 11/08/2009   LDLCALC 190 (H) 06/22/2008   Lab Results  Component Value Date   TRIG 98 11/08/2009   TRIG 152 (H) 06/22/2008   Lab Results  Component Value Date   CHOLHDL 3.1 Ratio 11/08/2009   CHOLHDL 5.3 Ratio 06/22/2008   No results found for: LDLDIRECT  Glucose:  Glucose, Bld  Date Value Ref Range Status  03/17/2013 80 70 - 99 mg/dL Final  05/05/2012 91 70 - 99 mg/dL Final  09/16/2010 94 70 - 99 mg/dL Final    Skin cancer: does not wear sunscreen  Colorectal cancer: colonoscopy overdue, she has been contacted by GI to set  up her colonoscopy and plans to schedule soon  Lung cancer:  Referral to lung cancer screening today  Aspirin: taking 81 daily ECG: not indicated   Patient Active Problem List   Diagnosis Date Noted  . Chest pain   . Hyperlipidemia   . Degeneration of lumbar intervertebral disc   . Chronic back pain   . Left axillary pain   . Hypercholesterolemia     Past Surgical History:  Procedure Laterality Date  . ABDOMINAL HYSTERECTOMY    . TONSILLECTOMY      Family History  Problem Relation Age of Onset  . Hypertension Mother    . Hyperlipidemia Mother   . Heart disease Mother        PACEMAKER  . Tuberculosis Father   . Hypertension Sister   . Hyperlipidemia Sister   . Other Sister        TOTAL KNEE REPLACEMENT  . Heart attack Maternal Grandmother   . Alzheimer's disease Maternal Grandfather     Social History   Socioeconomic History  . Marital status: Single    Spouse name: Not on file  . Number of children: Not on file  . Years of education: Not on file  . Highest education level: Not on file  Occupational History  . Not on file  Social Needs  . Financial resource strain: Not on file  . Food insecurity:    Worry: Not on file    Inability: Not on file  . Transportation needs:    Medical: Not on file    Non-medical: Not on file  Tobacco Use  . Smoking status: Current Every Day Smoker  . Smokeless tobacco: Never Used  Substance and Sexual Activity  . Alcohol use: No  . Drug use: No  . Sexual activity: Not on file  Lifestyle  . Physical activity:    Days per week: Not on file    Minutes per session: Not on file  . Stress: Not on file  Relationships  . Social connections:    Talks on phone: Not on file    Gets together: Not on file    Attends religious service: Not on file    Active member of club or organization: Not on file    Attends meetings of clubs or organizations: Not on file    Relationship status: Not on file  . Intimate partner violence:    Fear of current or ex partner: Not on file    Emotionally abused: Not on file    Physically abused: Not on file    Forced sexual activity: Not on file  Other Topics Concern  . Not on file  Social History Narrative  . Not on file     Current Outpatient Medications:  .  aspirin EC 81 MG tablet, Take 81 mg by mouth daily., Disp: , Rfl:  .  ezetimibe-simvastatin (VYTORIN) 10-20 MG per tablet, Take 1 tablet by mouth daily., Disp: , Rfl:  .  Multiple Vitamin (MULTIVITAMIN WITH MINERALS) TABS, Take 1 tablet by mouth daily., Disp: , Rfl:   .  oxyCODONE-Acetaminophen (PERCOCET PO), Take 15 mg by mouth., Disp: , Rfl:   Allergies  Allergen Reactions  . Tetracyclines & Related Hives and Itching    ROS  Constitutional: Negative for fever or weight change.  Respiratory: Negative for cough and shortness of breath.   Cardiovascular: Negative for chest pain or palpitations.  Gastrointestinal: Negative for abdominal pain, no bowel changes.  Musculoskeletal: Negative for gait problem or joint swelling.  Skin: Negative for rash.  Neurological: Negative for dizziness or headache.  No other specific complaints in a complete review of systems (except as listed in HPI above).   Objective  Vitals:   02/24/18 0903  BP: (!) 142/78  Pulse: 64  Resp: 16  Temp: 98.5 F (36.9 C)  TempSrc: Oral  SpO2: 96%  Weight: 185 lb (83.9 kg)  Height: 5\' 6"  (1.676 m)    Body mass index is 29.86 kg/m.  Physical Exam Vital signs reviewed Constitutional: Patient appears well-developed and well-nourished. No distress.  HENT: Head: Normocephalic and atraumatic. Ears: B TMs ok, no erythema or effusion; Nose: Nose normal. Mouth/Throat: Oropharynx is clear and moist. No oropharyngeal exudate.  Eyes: Conjunctivae and EOM are normal. Pupils are equal, round, and reactive to light. No scleral icterus.  Neck: Normal range of motion. Neck supple. No cervical adenopathy. No thyromegaly present.  Cardiovascular: Normal rate, regular rhythm and normal heart sounds.  No murmur heard. No BLE edema. Distal pulses intact Pulmonary/Chest: Effort normal and breath sounds normal. No respiratory distress. Abdominal: Soft. Bowel sounds are normal, no distension. There is no tenderness. no masses Musculoskeletal: Normal range of motion. No gross deformities Neurological: She is alert and oriented to person, place, and time. No cranial nerve deficit. Coordination, balance, strength, speech and gait are normal.  Skin: Skin is warm and dry. No rash noted. No  erythema.  Psychiatric: Patient has a normal mood and affect. behavior is normal. Judgment and thought content normal.   Fall Risk: Fall Risk  02/24/2018  Falls in the past year? No     Assessment & Plan RTC in 1 year for CPE, or sooner if needed  Elevated blood pressure reading Blood pressure slightly elevated with reported clinical HTN and normal home readings  Continue to monitor BP at home F/U for readings >140/90

## 2018-02-24 NOTE — Patient Instructions (Addendum)
Please schedule your bone scan at the front desk today.  Please head downstairs for lab work/x-rays. If any of your test results are critically abnormal, you will be contacted right away. Your results may be released to your MyChart for viewing before I am able to provide you with my response. I will contact you within a week about your test results and any recommendations for abnormalities.  Please try to check your blood pressure once daily or at least a few times a week, at the same time each day, and keep a log. Please follow up for readings over 140/90.  I will plan to see you back in 1 year, or sooner if needed.  Health Maintenance, Female Adopting a healthy lifestyle and getting preventive care can go a long way to promote health and wellness. Talk with your health care provider about what schedule of regular examinations is right for you. This is a good chance for you to check in with your provider about disease prevention and staying healthy. In between checkups, there are plenty of things you can do on your own. Experts have done a lot of research about which lifestyle changes and preventive measures are most likely to keep you healthy. Ask your health care provider for more information. Weight and diet Eat a healthy diet  Be sure to include plenty of vegetables, fruits, low-fat dairy products, and lean protein.  Do not eat a lot of foods high in solid fats, added sugars, or salt.  Get regular exercise. This is one of the most important things you can do for your health. ? Most adults should exercise for at least 150 minutes each week. The exercise should increase your heart rate and make you sweat (moderate-intensity exercise). ? Most adults should also do strengthening exercises at least twice a week. This is in addition to the moderate-intensity exercise.  Maintain a healthy weight  Body mass index (BMI) is a measurement that can be used to identify possible weight problems.  It estimates body fat based on height and weight. Your health care provider can help determine your BMI and help you achieve or maintain a healthy weight.  For females 21 years of age and older: ? A BMI below 18.5 is considered underweight. ? A BMI of 18.5 to 24.9 is normal. ? A BMI of 25 to 29.9 is considered overweight. ? A BMI of 30 and above is considered obese.  Watch levels of cholesterol and blood lipids  You should start having your blood tested for lipids and cholesterol at 68 years of age, then have this test every 5 years.  You may need to have your cholesterol levels checked more often if: ? Your lipid or cholesterol levels are high. ? You are older than 68 years of age. ? You are at high risk for heart disease.  Cancer screening Lung Cancer  Lung cancer screening is recommended for adults 73-80 years old who are at high risk for lung cancer because of a history of smoking.  A yearly low-dose CT scan of the lungs is recommended for people who: ? Currently smoke. ? Have quit within the past 15 years. ? Have at least a 30-pack-year history of smoking. A pack year is smoking an average of one pack of cigarettes a day for 1 year.  Yearly screening should continue until it has been 15 years since you quit.  Yearly screening should stop if you develop a health problem that would prevent you from having lung  cancer treatment.  Breast Cancer  Practice breast self-awareness. This means understanding how your breasts normally appear and feel.  It also means doing regular breast self-exams. Let your health care provider know about any changes, no matter how small.  If you are in your 20s or 30s, you should have a clinical breast exam (CBE) by a health care provider every 1-3 years as part of a regular health exam.  If you are 88 or older, have a CBE every year. Also consider having a breast X-ray (mammogram) every year.  If you have a family history of breast cancer, talk to  your health care provider about genetic screening.  If you are at high risk for breast cancer, talk to your health care provider about having an MRI and a mammogram every year.  Breast cancer gene (BRCA) assessment is recommended for women who have family members with BRCA-related cancers. BRCA-related cancers include: ? Breast. ? Ovarian. ? Tubal. ? Peritoneal cancers.  Results of the assessment will determine the need for genetic counseling and BRCA1 and BRCA2 testing.  Cervical Cancer Your health care provider may recommend that you be screened regularly for cancer of the pelvic organs (ovaries, uterus, and vagina). This screening involves a pelvic examination, including checking for microscopic changes to the surface of your cervix (Pap test). You may be encouraged to have this screening done every 3 years, beginning at age 74.  For women ages 13-65, health care providers may recommend pelvic exams and Pap testing every 3 years, or they may recommend the Pap and pelvic exam, combined with testing for human papilloma virus (HPV), every 5 years. Some types of HPV increase your risk of cervical cancer. Testing for HPV may also be done on women of any age with unclear Pap test results.  Other health care providers may not recommend any screening for nonpregnant women who are considered low risk for pelvic cancer and who do not have symptoms. Ask your health care provider if a screening pelvic exam is right for you.  If you have had past treatment for cervical cancer or a condition that could lead to cancer, you need Pap tests and screening for cancer for at least 20 years after your treatment. If Pap tests have been discontinued, your risk factors (such as having a new sexual partner) need to be reassessed to determine if screening should resume. Some women have medical problems that increase the chance of getting cervical cancer. In these cases, your health care provider may recommend more  frequent screening and Pap tests.  Colorectal Cancer  This type of cancer can be detected and often prevented.  Routine colorectal cancer screening usually begins at 68 years of age and continues through 68 years of age.  Your health care provider may recommend screening at an earlier age if you have risk factors for colon cancer.  Your health care provider may also recommend using home test kits to check for hidden blood in the stool.  A small camera at the end of a tube can be used to examine your colon directly (sigmoidoscopy or colonoscopy). This is done to check for the earliest forms of colorectal cancer.  Routine screening usually begins at age 88.  Direct examination of the colon should be repeated every 5-10 years through 68 years of age. However, you may need to be screened more often if early forms of precancerous polyps or small growths are found.  Skin Cancer  Check your skin from head to toe  regularly.  Tell your health care provider about any new moles or changes in moles, especially if there is a change in a mole's shape or color.  Also tell your health care provider if you have a mole that is larger than the size of a pencil eraser.  Always use sunscreen. Apply sunscreen liberally and repeatedly throughout the day.  Protect yourself by wearing long sleeves, pants, a wide-brimmed hat, and sunglasses whenever you are outside.  Heart disease, diabetes, and high blood pressure  High blood pressure causes heart disease and increases the risk of stroke. High blood pressure is more likely to develop in: ? People who have blood pressure in the high end of the normal range (130-139/85-89 mm Hg). ? People who are overweight or obese. ? People who are African American.  If you are 25-47 years of age, have your blood pressure checked every 3-5 years. If you are 4 years of age or older, have your blood pressure checked every year. You should have your blood pressure measured  twice-once when you are at a hospital or clinic, and once when you are not at a hospital or clinic. Record the average of the two measurements. To check your blood pressure when you are not at a hospital or clinic, you can use: ? An automated blood pressure machine at a pharmacy. ? A home blood pressure monitor.  If you are between 1 years and 1 years old, ask your health care provider if you should take aspirin to prevent strokes.  Have regular diabetes screenings. This involves taking a blood sample to check your fasting blood sugar level. ? If you are at a normal weight and have a low risk for diabetes, have this test once every three years after 68 years of age. ? If you are overweight and have a high risk for diabetes, consider being tested at a younger age or more often. Preventing infection Hepatitis B  If you have a higher risk for hepatitis B, you should be screened for this virus. You are considered at high risk for hepatitis B if: ? You were born in a country where hepatitis B is common. Ask your health care provider which countries are considered high risk. ? Your parents were born in a high-risk country, and you have not been immunized against hepatitis B (hepatitis B vaccine). ? You have HIV or AIDS. ? You use needles to inject street drugs. ? You live with someone who has hepatitis B. ? You have had sex with someone who has hepatitis B. ? You get hemodialysis treatment. ? You take certain medicines for conditions, including cancer, organ transplantation, and autoimmune conditions.  Hepatitis C  Blood testing is recommended for: ? Everyone born from [birth year] through 1966. ? Anyone with known risk factors for hepatitis C.  Sexually transmitted infections (STIs)  You should be screened for sexually transmitted infections (STIs) including gonorrhea and chlamydia if: ? You are sexually active and are younger than 68 years of age. ? You are older than 68 years of age and your  health care provider tells you that you are at risk for this type of infection. ? Your sexual activity has changed since you were last screened and you are at an increased risk for chlamydia or gonorrhea. Ask your health care provider if you are at risk.  If you do not have HIV, but are at risk, it may be recommended that you take a prescription medicine daily to prevent HIV infection. This  is called pre-exposure prophylaxis (PrEP). You are considered at risk if: ? You are sexually active and do not regularly use condoms or know the HIV status of your partner(s). ? You take drugs by injection. ? You are sexually active with a partner who has HIV.  Talk with your health care provider about whether you are at high risk of being infected with HIV. If you choose to begin PrEP, you should first be tested for HIV. You should then be tested every 3 months for as long as you are taking PrEP. Pregnancy  If you are premenopausal and you may become pregnant, ask your health care provider about preconception counseling.  If you may become pregnant, take 400 to 800 micrograms (mcg) of folic acid every day.  If you want to prevent pregnancy, talk to your health care provider about birth control (contraception). Osteoporosis and menopause  Osteoporosis is a disease in which the bones lose minerals and strength with aging. This can result in serious bone fractures. Your risk for osteoporosis can be identified using a bone density scan.  If you are 62 years of age or older, or if you are at risk for osteoporosis and fractures, ask your health care provider if you should be screened.  Ask your health care provider whether you should take a calcium or vitamin D supplement to lower your risk for osteoporosis.  Menopause may have certain physical symptoms and risks.  Hormone replacement therapy may reduce some of these symptoms and risks. Talk to your health care provider about whether hormone replacement  therapy is right for you. Follow these instructions at home:  Schedule regular health, dental, and eye exams.  Stay current with your immunizations.  Do not use any tobacco products including cigarettes, chewing tobacco, or electronic cigarettes.  If you are pregnant, do not drink alcohol.  If you are breastfeeding, limit how much and how often you drink alcohol.  Limit alcohol intake to no more than 1 drink per day for nonpregnant women. One drink equals 12 ounces of beer, 5 ounces of wine, or 1 ounces of hard liquor.  Do not use street drugs.  Do not share needles.  Ask your health care provider for help if you need support or information about quitting drugs.  Tell your health care provider if you often feel depressed.  Tell your health care provider if you have ever been abused or do not feel safe at home. This information is not intended to replace advice given to you by your health care provider. Make sure you discuss any questions you have with your health care provider. Document Released: 03/31/2011 Document Revised: 02/21/2016 Document Reviewed: 06/19/2015 Elsevier Interactive Patient Education  [employer].

## 2018-02-24 NOTE — Assessment & Plan Note (Signed)
-  USPSTF grade A and B recommendations reviewed with patient; age-appropriate recommendations, preventive care, screening tests, etc discussed and encouraged; healthy living and sunscreen use encouraged; see AVS for patient education given to patient.Declines advanced directives packet. -Discussed importance of 150 minutes of physical activity weekly, eat 6 servings of fruit/vegetables daily and drink plenty of water and avoid sweet beverages.  -Follow up and care instructions discussed and provided in AVS.  -Reviewed Health Maintenance:  Encounter for hepatitis C virus screening test for high risk patient- Hepatitis C antibody; Future Screening for osteoporosis- DG Bone Density; Future Need for Tdap vaccination- Tdap vaccine greater than or equal to 7yo IM Need for vaccination with 13-polyvalent pneumococcal conjugate vaccine- Pneumococcal conjugate vaccine 13-valent IM  Encounter for screening for lung cancer- Ambulatory Referral for Lung Cancer Scre  Abnormal glucose- Hemoglobin A1c; Future

## 2018-02-25 ENCOUNTER — Other Ambulatory Visit: Payer: Self-pay | Admitting: Acute Care

## 2018-02-25 DIAGNOSIS — F17210 Nicotine dependence, cigarettes, uncomplicated: Secondary | ICD-10-CM

## 2018-02-25 DIAGNOSIS — Z122 Encounter for screening for malignant neoplasm of respiratory organs: Secondary | ICD-10-CM

## 2018-02-25 LAB — HEPATITIS C ANTIBODY
Hepatitis C Ab: NONREACTIVE
SIGNAL TO CUT-OFF: 0.02 (ref <1.00)

## 2018-02-26 ENCOUNTER — Ambulatory Visit (INDEPENDENT_AMBULATORY_CARE_PROVIDER_SITE_OTHER)
Admission: RE | Admit: 2018-02-26 | Discharge: 2018-02-26 | Disposition: A | Discharge status: Home / Self Care | Payer: Medicare Other | Source: Ambulatory Visit | Attending: Nurse Practitioner | Admitting: Nurse Practitioner

## 2018-02-26 ENCOUNTER — Other Ambulatory Visit: Payer: Self-pay

## 2018-02-26 ENCOUNTER — Other Ambulatory Visit: Payer: Self-pay | Admitting: Nurse Practitioner

## 2018-02-26 DIAGNOSIS — E785 Hyperlipidemia, unspecified: Secondary | ICD-10-CM

## 2018-02-26 DIAGNOSIS — Z0000 Encounter for general adult medical examination without abnormal findings: Secondary | ICD-10-CM

## 2018-02-26 DIAGNOSIS — M810 Age-related osteoporosis without current pathological fracture: Secondary | ICD-10-CM

## 2018-02-26 DIAGNOSIS — R7303 Prediabetes: Secondary | ICD-10-CM

## 2018-02-26 DIAGNOSIS — Z13820 Encounter for screening for osteoporosis: Secondary | ICD-10-CM

## 2018-02-26 MED ORDER — EZETIMIBE-SIMVASTATIN 10-20 MG PO TABS: 1.0000 | ORAL_TABLET | Freq: Every day | ORAL | 3 refills | Status: DC

## 2018-03-05 ENCOUNTER — Telehealth: Payer: Self-pay

## 2018-03-05 ENCOUNTER — Other Ambulatory Visit: Payer: Self-pay | Admitting: Nurse Practitioner

## 2018-03-05 DIAGNOSIS — M810 Age-related osteoporosis without current pathological fracture: Secondary | ICD-10-CM

## 2018-03-05 DIAGNOSIS — G894 Chronic pain syndrome: Secondary | ICD-10-CM | POA: Diagnosis not present

## 2018-03-05 DIAGNOSIS — M5136 Other intervertebral disc degeneration, lumbar region: Secondary | ICD-10-CM | POA: Diagnosis not present

## 2018-03-05 DIAGNOSIS — M545 Low back pain: Secondary | ICD-10-CM | POA: Diagnosis not present

## 2018-03-05 DIAGNOSIS — Z79891 Long term (current) use of opiate analgesic: Secondary | ICD-10-CM | POA: Diagnosis not present

## 2018-03-05 NOTE — Progress Notes (Signed)
orders

## 2018-03-05 NOTE — Telephone Encounter (Signed)
error 

## 2018-03-08 ENCOUNTER — Encounter: Payer: Medicare Other | Admitting: Acute Care

## 2018-03-12 ENCOUNTER — Ambulatory Visit (INDEPENDENT_AMBULATORY_CARE_PROVIDER_SITE_OTHER): Payer: Medicare Other | Admitting: Acute Care

## 2018-03-12 ENCOUNTER — Encounter: Payer: Self-pay | Admitting: Acute Care

## 2018-03-12 ENCOUNTER — Telehealth: Payer: Self-pay | Admitting: Nurse Practitioner

## 2018-03-12 DIAGNOSIS — F17210 Nicotine dependence, cigarettes, uncomplicated: Secondary | ICD-10-CM | POA: Diagnosis not present

## 2018-03-12 NOTE — Telephone Encounter (Signed)
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 She states she wants to quite but does not want to hurt the ladies feeling. 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   I have informed her she may need an appointment to speak with provider about this request. She asked for a message to be send back first.   Please advise. Thank you.

## 2018-03-12 NOTE — Progress Notes (Signed)
Shared Decision Making Visit Lung Cancer Screening Program [phone])   Eligibility:  Age 67 y.o.  Pack Years Smoking History Calculation 35 pack year smoking history (# packs/per year x # years smoked)  Recent History of coughing up blood  no  Unexplained weight loss? no ( >Than 15 pounds within the last 6 months )  Prior History Lung / other cancer no (Diagnosis within the last 5 years already requiring surveillance chest CT Scans).  Smoking Status Current Smoker  Former Smokers: Years since quit: NA  Quit Date: NA  Visit Components:  Discussion included one or more decision making aids. yes  Discussion included risk/benefits of screening. yes  Discussion included potential follow up diagnostic testing for abnormal scans. yes  Discussion included meaning and risk of over diagnosis. yes  Discussion included meaning and risk of False Positives. yes  Discussion included meaning of total radiation exposure. yes  Counseling Included:  Importance of adherence to annual lung cancer LDCT screening. yes  Impact of comorbidities on ability to participate in the program. yes  Ability and willingness to under diagnostic treatment. yes  Smoking Cessation Counseling:  Current Smokers:   Discussed importance of smoking cessation. yes  Information about tobacco cessation classes and interventions provided to patient. yes  Patient provided with "ticket" for LDCT Scan. yes  Symptomatic Patient. no  Counseling  Diagnosis Code: Tobacco Use Z72.0  Asymptomatic Patient yes  Counseling (Intermediate counseling: > three minutes counseling) K1601  Former Smokers:   Discussed the importance of maintaining cigarette abstinence. yes  Diagnosis Code: Personal History of Nicotine Dependence. U93.235  Information about tobacco cessation classes and interventions provided to patient. Yes  Patient provided with "ticket" for LDCT Scan. yes  Written Order for Lung Cancer  Screening with LDCT placed in Epic. Yes (CT Chest Lung Cancer Screening Low Dose W/O CM) [id number] Z12.2-Screening of respiratory organs Z87.891-Personal history of nicotine dependence  I have spent 25 minutes of face to face time with Veronica Spence  discussing the risks and benefits of lung cancer screening. We viewed a power point together that explained in detail the above noted topics. We paused at intervals to allow for questions to be asked and answered to ensure understanding.We discussed that the single most powerful action that she  can take to decrease her risk of developing lung cancer is to quit smoking. We discussed whether or not she is ready to commit to setting a quit date.She is currently not ready to set a quit date. We discussed options for tools to aid in quitting smoking including nicotine replacement therapy, non-nicotine medications, support groups, Quit Smart classes, and behavior modification. We discussed that often times setting smaller, more achievable goals, such as eliminating 1 cigarette a day for a week and then 2 cigarettes a day for a week can be helpful in slowly decreasing the number of cigarettes smoked. This allows for a sense of accomplishment as well as providing a clinical benefit. I gave her the " Be Stronger Than Your Excuses" card with contact information for community resources, classes, free nicotine replacement therapy, and access to mobile apps, text messaging, and on-line smoking cessation help. I have also given her my card and contact information in the event she needs to contact me. We discussed the time and location of the scan, and that either Doroteo Glassman RN or I will call with the results within 24-48 hours of receiving them. I have offered her  a copy of the power point we viewed  as a resource in the event they need reinforcement of the concepts we discussed today in the office. The patient verbalized understanding of all of  the above and had no further  questions upon leaving the office. They have my contact information in the event they have any further questions.  I spent 4 minutes counseling on smoking cessation and the health risks of continued tobacco abuse.  I explained to the patient that there has been a high incidence of coronary artery disease noted on these exams. I explained that this is a non-gated exam therefore degree or severity cannot be determined. This patient is not on statin therapy. I have asked the patient to follow-up with their PCP regarding any incidental finding of coronary artery disease and management with diet or medication as their PCP  feels is clinically indicated. The patient verbalized understanding of the above and had no further questions upon completion of the visit.      Veronica Spatz, NP 03/12/2018 2:45 PM

## 2018-03-15 ENCOUNTER — Ambulatory Visit (INDEPENDENT_AMBULATORY_CARE_PROVIDER_SITE_OTHER)
Admission: RE | Admit: 2018-03-15 | Discharge: 2018-03-15 | Disposition: A | Discharge status: Home / Self Care | Payer: Medicare Other | Source: Ambulatory Visit | Attending: Acute Care | Admitting: Acute Care

## 2018-03-15 DIAGNOSIS — F17210 Nicotine dependence, cigarettes, uncomplicated: Secondary | ICD-10-CM

## 2018-03-15 DIAGNOSIS — Z122 Encounter for screening for malignant neoplasm of respiratory organs: Secondary | ICD-10-CM

## 2018-03-16 NOTE — Telephone Encounter (Signed)
Yes please have pt schedule OV to discuss back pain, thanks!

## 2018-03-16 NOTE — Telephone Encounter (Signed)
Patient states she does not need this anymore.

## 2018-03-18 ENCOUNTER — Other Ambulatory Visit: Payer: Self-pay | Admitting: Acute Care

## 2018-03-18 DIAGNOSIS — Z122 Encounter for screening for malignant neoplasm of respiratory organs: Secondary | ICD-10-CM

## 2018-03-18 DIAGNOSIS — F17210 Nicotine dependence, cigarettes, uncomplicated: Secondary | ICD-10-CM

## 2018-03-24 ENCOUNTER — Telehealth: Payer: Self-pay | Admitting: Nurse Practitioner

## 2018-03-24 NOTE — Telephone Encounter (Signed)
prolia rx will be sent to patient's pharmacy

## 2018-03-24 NOTE — Telephone Encounter (Addendum)
Called patient to schedule Prolia injection.  Insurance was submitted and verified. Patient would be responsible for a $0 copay if picked up at the pharmacy. She would like to do this.  Can this please be sent to [facility] on [address].  Appointment scheduled 04/07/2018. Patient also would like her results from her bone density test.

## 2018-03-25 ENCOUNTER — Emergency Department (HOSPITAL_COMMUNITY)
Admission: EM | Admit: 2018-03-25 | Discharge: 2018-03-25 | Disposition: A | Discharge status: Home / Self Care | Payer: Medicare Other | Attending: Emergency Medicine | Admitting: Emergency Medicine

## 2018-03-25 ENCOUNTER — Other Ambulatory Visit: Payer: Self-pay

## 2018-03-25 ENCOUNTER — Encounter (HOSPITAL_COMMUNITY): Payer: Self-pay | Admitting: *Deleted

## 2018-03-25 DIAGNOSIS — R21 Rash and other nonspecific skin eruption: Secondary | ICD-10-CM | POA: Insufficient documentation

## 2018-03-25 DIAGNOSIS — Z7982 Long term (current) use of aspirin: Secondary | ICD-10-CM | POA: Insufficient documentation

## 2018-03-25 DIAGNOSIS — Z79899 Other long term (current) drug therapy: Secondary | ICD-10-CM | POA: Diagnosis not present

## 2018-03-25 DIAGNOSIS — F17210 Nicotine dependence, cigarettes, uncomplicated: Secondary | ICD-10-CM | POA: Insufficient documentation

## 2018-03-25 MED ORDER — DENOSUMAB 60 MG/ML ~~LOC~~ SOSY: 60.0000 mg | PREFILLED_SYRINGE | Freq: Once | SUBCUTANEOUS | 0 refills | Status: AC

## 2018-03-25 MED ORDER — FAMOTIDINE 20 MG PO TABS: 20.0000 mg | ORAL_TABLET | Freq: Two times a day (BID) | ORAL | 0 refills | Status: DC

## 2018-03-25 MED ORDER — TRIAMCINOLONE ACETONIDE 0.1 % EX CREA: 1.0000 "application " | TOPICAL_CREAM | Freq: Two times a day (BID) | CUTANEOUS | 1 refills | Status: DC
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
## 2018-03-25 NOTE — ED Notes (Signed)
Patient able to ambulate independently  

## 2018-03-25 NOTE — Discharge Instructions (Signed)
Take pepcid as directed for the new few days.   You can also take half of a benadryl pill every 6-8 hours for somatic relief.  Be aware that this may make you drowsy.  Use the triamcinolone cream for itching.  As we discussed, this can help with eczema.  Do not use it every day.  Follow-up with your primary care doctor in the next week for further evaluation.  Return the emergency department for any fever, worsening rash, lesions in your mouth, rash that begins to spread throughout her entire body, difficulty breathing, swelling of her tongue or lips or any other worsening concerning symptoms.

## 2018-03-25 NOTE — ED Triage Notes (Signed)
Pt arrives ambulatory from triage, with c/o rash that started on her arm two days ago, now rash has progressed to her other arm and neck. Has been using aloe gel, no other medications. Pt denies use of any new meds, products or foods.

## 2018-03-25 NOTE — ED Notes (Signed)
Patient ambulated independently to restroom.

## 2018-03-25 NOTE — Telephone Encounter (Signed)
Per carson/sched, prolia questions have been answered, pt aware to keep rx cold until she brings to our office, prolia rx sent to patient's pharm ([facility]/[address])

## 2018-03-26 ENCOUNTER — Other Ambulatory Visit: Payer: Self-pay

## 2018-04-07 ENCOUNTER — Ambulatory Visit (INDEPENDENT_AMBULATORY_CARE_PROVIDER_SITE_OTHER): Payer: Medicare Other | Admitting: *Deleted

## 2018-04-07 DIAGNOSIS — M810 Age-related osteoporosis without current pathological fracture: Secondary | ICD-10-CM | POA: Diagnosis not present

## 2018-04-07 MED ORDER — DENOSUMAB 60 MG/ML ~~LOC~~ SOSY
60.0000 mg | PREFILLED_SYRINGE | Freq: Once | SUBCUTANEOUS | Status: AC
  Administered 2018-04-07: 60 mg via SUBCUTANEOUS

## 2018-04-09 DIAGNOSIS — G894 Chronic pain syndrome: Secondary | ICD-10-CM | POA: Diagnosis not present

## 2018-04-09 DIAGNOSIS — Z79891 Long term (current) use of opiate analgesic: Secondary | ICD-10-CM | POA: Diagnosis not present

## 2018-04-09 DIAGNOSIS — M5136 Other intervertebral disc degeneration, lumbar region: Secondary | ICD-10-CM | POA: Diagnosis not present

## 2018-04-09 DIAGNOSIS — M545 Low back pain: Secondary | ICD-10-CM | POA: Diagnosis not present

## 2018-04-17 DIAGNOSIS — M791 Myalgia, unspecified site: Secondary | ICD-10-CM | POA: Diagnosis not present

## 2018-04-17 DIAGNOSIS — M545 Low back pain: Secondary | ICD-10-CM | POA: Diagnosis not present

## 2018-04-19 DIAGNOSIS — L300 Nummular dermatitis: Secondary | ICD-10-CM | POA: Diagnosis not present

## 2018-05-05 DIAGNOSIS — M5136 Other intervertebral disc degeneration, lumbar region: Secondary | ICD-10-CM | POA: Diagnosis not present

## 2018-05-05 DIAGNOSIS — M545 Low back pain: Secondary | ICD-10-CM | POA: Diagnosis not present

## 2018-05-05 DIAGNOSIS — G894 Chronic pain syndrome: Secondary | ICD-10-CM | POA: Diagnosis not present

## 2018-05-05 DIAGNOSIS — Z79891 Long term (current) use of opiate analgesic: Secondary | ICD-10-CM | POA: Diagnosis not present

## 2018-05-19 DIAGNOSIS — L821 Other seborrheic keratosis: Secondary | ICD-10-CM | POA: Diagnosis not present

## 2018-05-19 DIAGNOSIS — L300 Nummular dermatitis: Secondary | ICD-10-CM | POA: Diagnosis not present

## 2018-05-21 ENCOUNTER — Encounter (HOSPITAL_COMMUNITY): Payer: Self-pay

## 2018-05-21 ENCOUNTER — Ambulatory Visit (INDEPENDENT_AMBULATORY_CARE_PROVIDER_SITE_OTHER): Payer: Medicare Other

## 2018-05-21 ENCOUNTER — Ambulatory Visit (HOSPITAL_COMMUNITY)
Admission: EM | Admit: 2018-05-21 | Discharge: 2018-05-21 | Disposition: A | Discharge status: Home / Self Care | Payer: Medicare Other | Attending: Family Medicine | Admitting: Family Medicine

## 2018-05-21 DIAGNOSIS — M25561 Pain in right knee: Secondary | ICD-10-CM

## 2018-05-21 DIAGNOSIS — G8929 Other chronic pain: Secondary | ICD-10-CM

## 2018-05-21 DIAGNOSIS — M1711 Unilateral primary osteoarthritis, right knee: Secondary | ICD-10-CM | POA: Diagnosis not present

## 2018-05-21 MED ORDER — PREDNISONE 10 MG (21) PO TBPK
ORAL_TABLET | Freq: Every day | ORAL | 0 refills | Status: DC
Start: 2018-05-21 — End: 2019-02-01

## 2018-05-21 NOTE — ED Triage Notes (Signed)
Pt presents with right knee pain not related to any injury. Pt also complains of tightness in the back of right calf.

## 2018-05-21 NOTE — Discharge Instructions (Signed)
Your knee has arthritis Take the prednisone as directed Limit walking when knee painful Return as needed
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
## 2018-06-07 DIAGNOSIS — G894 Chronic pain syndrome: Secondary | ICD-10-CM | POA: Diagnosis not present

## 2018-06-07 DIAGNOSIS — M25561 Pain in right knee: Secondary | ICD-10-CM | POA: Diagnosis not present

## 2018-06-07 DIAGNOSIS — Z79891 Long term (current) use of opiate analgesic: Secondary | ICD-10-CM | POA: Diagnosis not present

## 2018-06-07 DIAGNOSIS — M545 Low back pain: Secondary | ICD-10-CM | POA: Diagnosis not present

## 2018-06-07 DIAGNOSIS — M5136 Other intervertebral disc degeneration, lumbar region: Secondary | ICD-10-CM | POA: Diagnosis not present

## 2018-07-06 DIAGNOSIS — M5136 Other intervertebral disc degeneration, lumbar region: Secondary | ICD-10-CM | POA: Diagnosis not present

## 2018-07-06 DIAGNOSIS — M25561 Pain in right knee: Secondary | ICD-10-CM | POA: Diagnosis not present

## 2018-07-06 DIAGNOSIS — Z79891 Long term (current) use of opiate analgesic: Secondary | ICD-10-CM | POA: Diagnosis not present

## 2018-07-06 DIAGNOSIS — G894 Chronic pain syndrome: Secondary | ICD-10-CM | POA: Diagnosis not present

## 2018-07-06 DIAGNOSIS — M545 Low back pain: Secondary | ICD-10-CM | POA: Diagnosis not present

## 2018-08-03 ENCOUNTER — Other Ambulatory Visit: Payer: Self-pay | Admitting: Nurse Practitioner

## 2018-08-03 DIAGNOSIS — Z1231 Encounter for screening mammogram for malignant neoplasm of breast: Secondary | ICD-10-CM

## 2018-08-06 DIAGNOSIS — M25561 Pain in right knee: Secondary | ICD-10-CM | POA: Diagnosis not present

## 2018-08-06 DIAGNOSIS — G894 Chronic pain syndrome: Secondary | ICD-10-CM | POA: Diagnosis not present

## 2018-08-06 DIAGNOSIS — M545 Low back pain: Secondary | ICD-10-CM | POA: Diagnosis not present

## 2018-08-06 DIAGNOSIS — Z79891 Long term (current) use of opiate analgesic: Secondary | ICD-10-CM | POA: Diagnosis not present

## 2018-08-06 DIAGNOSIS — M5136 Other intervertebral disc degeneration, lumbar region: Secondary | ICD-10-CM | POA: Diagnosis not present

## 2018-09-03 DIAGNOSIS — M25561 Pain in right knee: Secondary | ICD-10-CM | POA: Diagnosis not present

## 2018-09-03 DIAGNOSIS — M545 Low back pain: Secondary | ICD-10-CM | POA: Diagnosis not present

## 2018-09-03 DIAGNOSIS — G894 Chronic pain syndrome: Secondary | ICD-10-CM | POA: Diagnosis not present

## 2018-09-03 DIAGNOSIS — M5136 Other intervertebral disc degeneration, lumbar region: Secondary | ICD-10-CM | POA: Diagnosis not present

## 2018-09-03 DIAGNOSIS — Z79891 Long term (current) use of opiate analgesic: Secondary | ICD-10-CM | POA: Diagnosis not present

## 2018-09-15 ENCOUNTER — Ambulatory Visit: Payer: Medicare Other

## 2018-10-06 DIAGNOSIS — M25561 Pain in right knee: Secondary | ICD-10-CM | POA: Diagnosis not present

## 2018-10-06 DIAGNOSIS — G894 Chronic pain syndrome: Secondary | ICD-10-CM | POA: Diagnosis not present

## 2018-10-06 DIAGNOSIS — M5136 Other intervertebral disc degeneration, lumbar region: Secondary | ICD-10-CM | POA: Diagnosis not present

## 2018-10-06 DIAGNOSIS — Z79891 Long term (current) use of opiate analgesic: Secondary | ICD-10-CM | POA: Diagnosis not present

## 2018-10-06 DIAGNOSIS — E7800 Pure hypercholesterolemia, unspecified: Secondary | ICD-10-CM | POA: Diagnosis not present

## 2018-10-06 DIAGNOSIS — G8929 Other chronic pain: Secondary | ICD-10-CM | POA: Diagnosis not present

## 2018-10-06 DIAGNOSIS — M545 Low back pain: Secondary | ICD-10-CM | POA: Diagnosis not present

## 2018-10-18 DIAGNOSIS — M791 Myalgia, unspecified site: Secondary | ICD-10-CM | POA: Diagnosis not present

## 2018-10-18 DIAGNOSIS — M545 Low back pain: Secondary | ICD-10-CM | POA: Diagnosis not present

## 2018-10-21 ENCOUNTER — Ambulatory Visit
Admission: RE | Admit: 2018-10-21 | Discharge: 2018-10-21 | Disposition: A | Discharge status: Home / Self Care | Payer: Medicare Other | Source: Ambulatory Visit | Attending: Nurse Practitioner | Admitting: Nurse Practitioner

## 2018-10-21 DIAGNOSIS — Z1231 Encounter for screening mammogram for malignant neoplasm of breast: Secondary | ICD-10-CM

## 2018-10-30 ENCOUNTER — Other Ambulatory Visit: Payer: Self-pay

## 2018-10-30 ENCOUNTER — Ambulatory Visit (HOSPITAL_COMMUNITY)
Admission: EM | Admit: 2018-10-30 | Discharge: 2018-10-30 | Disposition: A | Discharge status: Home / Self Care | Payer: Medicare Other | Attending: Family Medicine | Admitting: Family Medicine

## 2018-10-30 ENCOUNTER — Encounter (HOSPITAL_COMMUNITY): Payer: Self-pay

## 2018-10-30 DIAGNOSIS — R69 Illness, unspecified: Secondary | ICD-10-CM

## 2018-10-30 DIAGNOSIS — J111 Influenza due to unidentified influenza virus with other respiratory manifestations: Secondary | ICD-10-CM

## 2018-10-30 MED ORDER — HYDROCODONE-HOMATROPINE 5-1.5 MG/5ML PO SYRP: 5.0000 mL | ORAL_SOLUTION | Freq: Four times a day (QID) | ORAL | 0 refills | Status: DC | PRN

## 2018-10-30 MED ORDER — OSELTAMIVIR PHOSPHATE 75 MG PO CAPS: 75.0000 mg | ORAL_CAPSULE | Freq: Two times a day (BID) | ORAL | 0 refills | Status: DC
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
## 2018-10-30 NOTE — ED Triage Notes (Signed)
Pt presents today with body aches, cough, and congestion. States she felt hot last night but unsure if she was running a fever. Has been going on since Thursday.

## 2018-11-03 DIAGNOSIS — G894 Chronic pain syndrome: Secondary | ICD-10-CM | POA: Diagnosis not present

## 2018-11-03 DIAGNOSIS — M5136 Other intervertebral disc degeneration, lumbar region: Secondary | ICD-10-CM | POA: Diagnosis not present

## 2018-11-03 DIAGNOSIS — M545 Low back pain: Secondary | ICD-10-CM | POA: Diagnosis not present

## 2018-11-03 DIAGNOSIS — Z79891 Long term (current) use of opiate analgesic: Secondary | ICD-10-CM | POA: Diagnosis not present

## 2018-11-03 DIAGNOSIS — M25561 Pain in right knee: Secondary | ICD-10-CM | POA: Diagnosis not present

## 2018-11-10 ENCOUNTER — Telehealth: Payer: Self-pay | Admitting: Nurse Practitioner

## 2018-11-10 NOTE — Telephone Encounter (Signed)
Insurance has been submitted and verified for Prolia. Patient is responsible for a $230 copay. Due anytime. Patient states that her mother passed away yesterday and she does not want to schedule at this time. She will call back when she is ready.  Okay to schedule... Visit Note: Prolia ($230 copay - okay to give per Gareth Eagle) Visit Type: Nurse Provider: Nurse

## 2018-11-29 DIAGNOSIS — M545 Low back pain: Secondary | ICD-10-CM | POA: Diagnosis not present

## 2018-11-29 DIAGNOSIS — G894 Chronic pain syndrome: Secondary | ICD-10-CM | POA: Diagnosis not present

## 2018-11-29 DIAGNOSIS — Z79891 Long term (current) use of opiate analgesic: Secondary | ICD-10-CM | POA: Diagnosis not present

## 2018-11-29 DIAGNOSIS — M5136 Other intervertebral disc degeneration, lumbar region: Secondary | ICD-10-CM | POA: Diagnosis not present

## 2018-11-29 DIAGNOSIS — M25561 Pain in right knee: Secondary | ICD-10-CM | POA: Diagnosis not present

## 2018-12-22 DIAGNOSIS — L300 Nummular dermatitis: Secondary | ICD-10-CM | POA: Diagnosis not present

## 2018-12-22 DIAGNOSIS — L239 Allergic contact dermatitis, unspecified cause: Secondary | ICD-10-CM | POA: Diagnosis not present

## 2018-12-27 DIAGNOSIS — M25561 Pain in right knee: Secondary | ICD-10-CM | POA: Diagnosis not present

## 2018-12-27 DIAGNOSIS — G894 Chronic pain syndrome: Secondary | ICD-10-CM | POA: Diagnosis not present

## 2018-12-27 DIAGNOSIS — M5136 Other intervertebral disc degeneration, lumbar region: Secondary | ICD-10-CM | POA: Diagnosis not present

## 2018-12-27 DIAGNOSIS — M545 Low back pain: Secondary | ICD-10-CM | POA: Diagnosis not present

## 2018-12-27 DIAGNOSIS — Z79891 Long term (current) use of opiate analgesic: Secondary | ICD-10-CM | POA: Diagnosis not present

## 2019-01-24 DIAGNOSIS — M25561 Pain in right knee: Secondary | ICD-10-CM | POA: Diagnosis not present

## 2019-01-24 DIAGNOSIS — M545 Low back pain: Secondary | ICD-10-CM | POA: Diagnosis not present

## 2019-01-24 DIAGNOSIS — G894 Chronic pain syndrome: Secondary | ICD-10-CM | POA: Diagnosis not present

## 2019-01-24 DIAGNOSIS — M5136 Other intervertebral disc degeneration, lumbar region: Secondary | ICD-10-CM | POA: Diagnosis not present

## 2019-01-24 DIAGNOSIS — Z79891 Long term (current) use of opiate analgesic: Secondary | ICD-10-CM | POA: Diagnosis not present

## 2019-02-01 ENCOUNTER — Telehealth: Payer: Self-pay

## 2019-02-01 DIAGNOSIS — R7303 Prediabetes: Secondary | ICD-10-CM

## 2019-02-01 NOTE — Telephone Encounter (Signed)
Left detailed message for patient to call back to coordinate labs,virtual visit and prolia injection, needs to talk with Wyat Infinger,RN

## 2019-02-01 NOTE — Telephone Encounter (Signed)
Left detailed message, patient needs to be transferred to [place] office to talk over labs needed, virtual follow up visit needed and also prolia injection needed---ok to transfer to Crista Nuon,RN at [place] office to coordinate what we need to do next

## 2019-02-01 NOTE — Telephone Encounter (Signed)
Routing to dr burns, patient is due prolia injection (actually was due in jan/2020)---last calcium labs were 01/2018, calcium level normal---do you recommend she come in for labs before we proceed with prolia?  I don't see any future appts with provider at this time---so she may also be due for follow up for diabetes as well----please advise, thanks

## 2019-02-01 NOTE — Telephone Encounter (Signed)
Low risk for low calcium and calcium has always been normal, so ok to proceed with prolia.    Should be schedule a routine f/u visit with someone ( I can do it) -- make clear it is not to establish but to get blood work and f/u on her chronic med problems.

## 2019-02-12 ENCOUNTER — Other Ambulatory Visit: Payer: Self-pay

## 2019-02-12 ENCOUNTER — Ambulatory Visit (HOSPITAL_COMMUNITY)
Admission: EM | Admit: 2019-02-12 | Discharge: 2019-02-12 | Disposition: A | Discharge status: Home / Self Care | Payer: Medicare Other | Attending: Emergency Medicine | Admitting: Emergency Medicine

## 2019-02-12 ENCOUNTER — Encounter (HOSPITAL_COMMUNITY): Payer: Self-pay | Admitting: Emergency Medicine

## 2019-02-12 DIAGNOSIS — M25561 Pain in right knee: Secondary | ICD-10-CM | POA: Diagnosis not present

## 2019-02-12 MED ORDER — DICLOFENAC SODIUM 1 % TD GEL: 2.0000 g | Freq: Four times a day (QID) | TRANSDERMAL | 0 refills | Status: DC
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
## 2019-02-12 NOTE — Discharge Instructions (Signed)
Light and regular activity as tolerated.  Topical gel 4 times a day.  Ice application.  Light stretching.  Use of your brace.  Follow up with orthopedics as needed for persistent pain.  Return if worsening, redness, warmth or swelling develops.

## 2019-02-12 NOTE — ED Triage Notes (Signed)
Pt here for right knee pain chronic in nature worse x 1 week

## 2019-02-23 DIAGNOSIS — G894 Chronic pain syndrome: Secondary | ICD-10-CM | POA: Diagnosis not present

## 2019-02-23 DIAGNOSIS — M545 Low back pain: Secondary | ICD-10-CM | POA: Diagnosis not present

## 2019-02-23 DIAGNOSIS — Z79891 Long term (current) use of opiate analgesic: Secondary | ICD-10-CM | POA: Diagnosis not present

## 2019-02-23 DIAGNOSIS — M25561 Pain in right knee: Secondary | ICD-10-CM | POA: Diagnosis not present

## 2019-02-23 DIAGNOSIS — M5136 Other intervertebral disc degeneration, lumbar region: Secondary | ICD-10-CM | POA: Diagnosis not present

## 2019-03-03 DIAGNOSIS — M810 Age-related osteoporosis without current pathological fracture: Secondary | ICD-10-CM | POA: Insufficient documentation

## 2019-03-03 NOTE — Patient Instructions (Addendum)
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
## 2019-03-04 ENCOUNTER — Ambulatory Visit (INDEPENDENT_AMBULATORY_CARE_PROVIDER_SITE_OTHER): Payer: Medicare Other | Admitting: Internal Medicine

## 2019-03-04 ENCOUNTER — Other Ambulatory Visit (INDEPENDENT_AMBULATORY_CARE_PROVIDER_SITE_OTHER): Payer: Medicare Other

## 2019-03-04 ENCOUNTER — Encounter: Payer: Self-pay | Admitting: Internal Medicine

## 2019-03-04 ENCOUNTER — Other Ambulatory Visit: Payer: Self-pay

## 2019-03-04 ENCOUNTER — Telehealth: Payer: Self-pay

## 2019-03-04 VITALS — BP 150/72 | HR 63 | Temp 98.2°F | Resp 16 | Ht 66.0 in | Wt 182.0 lb

## 2019-03-04 DIAGNOSIS — R030 Elevated blood-pressure reading, without diagnosis of hypertension: Secondary | ICD-10-CM | POA: Diagnosis not present

## 2019-03-04 DIAGNOSIS — M810 Age-related osteoporosis without current pathological fracture: Secondary | ICD-10-CM

## 2019-03-04 DIAGNOSIS — G479 Sleep disorder, unspecified: Secondary | ICD-10-CM | POA: Insufficient documentation

## 2019-03-04 DIAGNOSIS — Z23 Encounter for immunization: Secondary | ICD-10-CM

## 2019-03-04 DIAGNOSIS — E785 Hyperlipidemia, unspecified: Secondary | ICD-10-CM

## 2019-03-04 DIAGNOSIS — R7303 Prediabetes: Secondary | ICD-10-CM | POA: Diagnosis not present

## 2019-03-04 LAB — COMPREHENSIVE METABOLIC PANEL WITH GFR
ALT: 15 U/L (ref 0–35)
AST: 18 U/L (ref 0–37)
Albumin: 4.1 g/dL (ref 3.5–5.2)
Alkaline Phosphatase: 60 U/L (ref 39–117)
BUN: 12 mg/dL (ref 6–23)
CO2: 27 meq/L (ref 19–32)
Calcium: 9.5 mg/dL (ref 8.4–10.5)
Chloride: 105 meq/L (ref 96–112)
Creatinine, Ser: 0.97 mg/dL (ref 0.40–1.20)
GFR: 68.9 mL/min
Glucose, Bld: 89 mg/dL (ref 70–99)
Potassium: 3.9 meq/L (ref 3.5–5.1)
Sodium: 140 meq/L (ref 135–145)
Total Bilirubin: 0.4 mg/dL (ref 0.2–1.2)
Total Protein: 6.7 g/dL (ref 6.0–8.3)

## 2019-03-04 LAB — CBC WITH DIFFERENTIAL/PLATELET
Basophils Absolute: 0 10*3/uL (ref 0.0–0.1)
Basophils Relative: 0.5 % (ref 0.0–3.0)
Eosinophils Absolute: 0.1 10*3/uL (ref 0.0–0.7)
Eosinophils Relative: 1.4 % (ref 0.0–5.0)
HCT: 40 % (ref 36.0–46.0)
Hemoglobin: 13 g/dL (ref 12.0–15.0)
Lymphocytes Relative: 33.2 % (ref 12.0–46.0)
Lymphs Abs: 1.8 10*3/uL (ref 0.7–4.0)
MCHC: 32.6 g/dL (ref 30.0–36.0)
MCV: 79.9 fl (ref 78.0–100.0)
Monocytes Absolute: 0.6 10*3/uL (ref 0.1–1.0)
Monocytes Relative: 10.4 % (ref 3.0–12.0)
Neutro Abs: 3 10*3/uL (ref 1.4–7.7)
Neutrophils Relative %: 54.5 % (ref 43.0–77.0)
Platelets: 181 10*3/uL (ref 150.0–400.0)
RBC: 5.01 Mil/uL (ref 3.87–5.11)
RDW: 16.8 % — ABNORMAL HIGH (ref 11.5–15.5)
WBC: 5.5 10*3/uL (ref 4.0–10.5)

## 2019-03-04 LAB — HEMOGLOBIN A1C: Hgb A1c MFr Bld: 6.2 % (ref 4.6–6.5)

## 2019-03-04 LAB — VITAMIN D 25 HYDROXY (VIT D DEFICIENCY, FRACTURES): VITD: 31.91 ng/mL (ref 30.00–100.00)

## 2019-03-04 MED ORDER — DICLOFENAC SODIUM 1 % TD GEL: 2.0000 g | Freq: Four times a day (QID) | TRANSDERMAL | 0 refills | Status: AC

## 2019-03-04 NOTE — Assessment & Plan Note (Signed)
Overdue for Prolia-needs blood work done [employer], vitamin D level Taking a multivitamin Not exercising-stressed regular exercise

## 2019-03-04 NOTE — Telephone Encounter (Signed)
I am accepting patient-she will need to establish with a new PCP

## 2019-03-04 NOTE — Assessment & Plan Note (Signed)
Blood pressure elevated here, but tends to be well controlled at home Has known whitecoat hypertension Advised her to bring her cuff in the next time she comes here to double check that it is accurate Encourage regular exercise Low-sodium diet CMP

## 2019-03-04 NOTE — Assessment & Plan Note (Signed)
Check lipid panel  Continue daily Vytorin Regular exercise and healthy diet encouraged

## 2019-03-04 NOTE — Assessment & Plan Note (Signed)
Has been having some difficulty sleeping and mild depression This is related to her mother's death who she was very close with.  Her mother died in 12/22/2022 Discussed that she can try melatonin or trazodone.  She states they have not worked for her in the past Discussed other options, but encouraged her to try to work through this without medication and she agrees She can let us know if she feels that she needs to discuss medication options in the near future

## 2019-03-04 NOTE — Assessment & Plan Note (Signed)
Check a1c Low sugar / carb diet Stressed regular exercise   

## 2019-03-04 NOTE — Telephone Encounter (Signed)
Patient is getting labs done today needed for prolia injection.

## 2019-03-07 NOTE — Telephone Encounter (Signed)
Will you please have patient establish with another provider. Dr. Quay Burow saw her for a follow up because she was due but is not accepting her as a patient right now.

## 2019-03-07 NOTE — Telephone Encounter (Signed)
Patient set up for TOC to [facility] in December.

## 2019-03-16 ENCOUNTER — Other Ambulatory Visit: Payer: Self-pay | Admitting: *Deleted

## 2019-03-16 DIAGNOSIS — E785 Hyperlipidemia, unspecified: Secondary | ICD-10-CM

## 2019-03-16 MED ORDER — EZETIMIBE-SIMVASTATIN 10-20 MG PO TABS: 1.0000 | ORAL_TABLET | Freq: Every day | ORAL | 0 refills | Status: DC

## 2019-03-21 ENCOUNTER — Other Ambulatory Visit: Payer: Self-pay

## 2019-03-21 DIAGNOSIS — G894 Chronic pain syndrome: Secondary | ICD-10-CM | POA: Diagnosis not present

## 2019-03-21 DIAGNOSIS — Z79891 Long term (current) use of opiate analgesic: Secondary | ICD-10-CM | POA: Diagnosis not present

## 2019-03-21 DIAGNOSIS — M5136 Other intervertebral disc degeneration, lumbar region: Secondary | ICD-10-CM | POA: Diagnosis not present

## 2019-03-21 DIAGNOSIS — M25561 Pain in right knee: Secondary | ICD-10-CM | POA: Diagnosis not present

## 2019-03-21 DIAGNOSIS — M545 Low back pain: Secondary | ICD-10-CM | POA: Diagnosis not present

## 2019-03-21 MED ORDER — DENOSUMAB 60 MG/ML ~~LOC~~ SOSY: 60.0000 mg | PREFILLED_SYRINGE | Freq: Once | SUBCUTANEOUS | 0 refills | Status: AC

## 2019-03-23 ENCOUNTER — Other Ambulatory Visit: Payer: Self-pay

## 2019-03-23 ENCOUNTER — Ambulatory Visit (HOSPITAL_COMMUNITY)
Admission: EM | Admit: 2019-03-23 | Discharge: 2019-03-23 | Disposition: A | Discharge status: Home / Self Care | Payer: Medicare Other | Attending: Family Medicine | Admitting: Family Medicine

## 2019-03-23 ENCOUNTER — Ambulatory Visit (INDEPENDENT_AMBULATORY_CARE_PROVIDER_SITE_OTHER): Payer: Medicare Other

## 2019-03-23 ENCOUNTER — Encounter (HOSPITAL_COMMUNITY): Payer: Self-pay

## 2019-03-23 DIAGNOSIS — M79671 Pain in right foot: Secondary | ICD-10-CM | POA: Diagnosis not present

## 2019-03-23 DIAGNOSIS — M79674 Pain in right toe(s): Secondary | ICD-10-CM

## 2019-03-23 DIAGNOSIS — S92511A Displaced fracture of proximal phalanx of right lesser toe(s), initial encounter for closed fracture: Secondary | ICD-10-CM | POA: Diagnosis not present

## 2019-03-23 DIAGNOSIS — M7731 Calcaneal spur, right foot: Secondary | ICD-10-CM | POA: Diagnosis not present

## 2019-03-23 DIAGNOSIS — S92521A Displaced fracture of medial phalanx of right lesser toe(s), initial encounter for closed fracture: Secondary | ICD-10-CM

## 2019-03-23 DIAGNOSIS — M19071 Primary osteoarthritis, right ankle and foot: Secondary | ICD-10-CM | POA: Diagnosis not present

## 2019-03-23 NOTE — ED Triage Notes (Signed)
Pt cc states she  Stump her toe on Friday and it's been hurting ever sense.  (Right foot third toe)

## 2019-03-23 NOTE — Discharge Instructions (Addendum)
If not allergic, you may use over the counter ibuprofen or acetaminophen as needed. ° °
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
## 2019-03-28 ENCOUNTER — Telehealth: Payer: Self-pay | Admitting: General Practice

## 2019-03-28 NOTE — Telephone Encounter (Signed)
Patient has picked up rx at pharmacy, used pharm side of her benefits---will be bringing prolia shot to me for administration at [facility]---need to document as "patient supplied med"---scheduled nurse visit for 6/30 at 2:20

## 2019-03-28 NOTE — Telephone Encounter (Signed)
Patient is calling back to ask Irene Shipper a question regarding her Projelia. Thank you

## 2019-03-28 NOTE — Telephone Encounter (Signed)
Pt requesting call back in regard.

## 2019-03-29 ENCOUNTER — Ambulatory Visit (INDEPENDENT_AMBULATORY_CARE_PROVIDER_SITE_OTHER): Payer: Medicare Other

## 2019-03-29 DIAGNOSIS — M810 Age-related osteoporosis without current pathological fracture: Secondary | ICD-10-CM

## 2019-03-29 MED ORDER — DENOSUMAB 60 MG/ML ~~LOC~~ SOSY
60.0000 mg | PREFILLED_SYRINGE | Freq: Once | SUBCUTANEOUS | Status: AC
  Administered 2019-03-29: 60 mg via SUBCUTANEOUS

## 2019-03-29 NOTE — Progress Notes (Signed)
prolia Injection given.   Stacy J Burns, MD  

## 2019-03-29 NOTE — Telephone Encounter (Signed)
Patient was advising that she has prolia injection she picked up from pharmacy, she has scheduled nurse visit for administration, patient is aware to keep refrigerated until she brings injection with her

## 2019-04-08 ENCOUNTER — Other Ambulatory Visit: Payer: Self-pay | Admitting: *Deleted

## 2019-04-08 DIAGNOSIS — Z87891 Personal history of nicotine dependence: Secondary | ICD-10-CM

## 2019-04-08 DIAGNOSIS — Z122 Encounter for screening for malignant neoplasm of respiratory organs: Secondary | ICD-10-CM

## 2019-04-08 DIAGNOSIS — F17210 Nicotine dependence, cigarettes, uncomplicated: Secondary | ICD-10-CM

## 2019-04-11 ENCOUNTER — Ambulatory Visit (INDEPENDENT_AMBULATORY_CARE_PROVIDER_SITE_OTHER): Payer: Medicare Other | Admitting: Family

## 2019-04-11 ENCOUNTER — Encounter: Payer: Self-pay | Admitting: Family

## 2019-04-11 ENCOUNTER — Ambulatory Visit (INDEPENDENT_AMBULATORY_CARE_PROVIDER_SITE_OTHER)
Admission: RE | Admit: 2019-04-11 | Discharge: 2019-04-11 | Disposition: A | Discharge status: Home / Self Care | Payer: Medicare Other | Source: Ambulatory Visit | Attending: Family | Admitting: Family

## 2019-04-11 ENCOUNTER — Other Ambulatory Visit: Payer: Self-pay

## 2019-04-11 VITALS — BP 144/76 | HR 79 | Temp 98.8°F | Ht 66.0 in | Wt 180.0 lb

## 2019-04-11 DIAGNOSIS — M545 Low back pain, unspecified: Secondary | ICD-10-CM

## 2019-04-11 DIAGNOSIS — G8929 Other chronic pain: Secondary | ICD-10-CM

## 2019-04-11 DIAGNOSIS — M542 Cervicalgia: Secondary | ICD-10-CM
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
## 2019-04-12 ENCOUNTER — Other Ambulatory Visit: Payer: Self-pay | Admitting: Family

## 2019-04-12 DIAGNOSIS — M810 Age-related osteoporosis without current pathological fracture: Secondary | ICD-10-CM

## 2019-04-12 DIAGNOSIS — G8929 Other chronic pain: Secondary | ICD-10-CM

## 2019-04-12 DIAGNOSIS — M5136 Other intervertebral disc degeneration, lumbar region: Secondary | ICD-10-CM

## 2019-04-18 DIAGNOSIS — G894 Chronic pain syndrome: Secondary | ICD-10-CM | POA: Diagnosis not present

## 2019-04-18 DIAGNOSIS — M25561 Pain in right knee: Secondary | ICD-10-CM | POA: Diagnosis not present

## 2019-04-18 DIAGNOSIS — M545 Low back pain: Secondary | ICD-10-CM | POA: Diagnosis not present

## 2019-04-18 DIAGNOSIS — M791 Myalgia, unspecified site: Secondary | ICD-10-CM | POA: Diagnosis not present

## 2019-04-18 DIAGNOSIS — M5136 Other intervertebral disc degeneration, lumbar region: Secondary | ICD-10-CM | POA: Diagnosis not present

## 2019-04-18 DIAGNOSIS — Z79891 Long term (current) use of opiate analgesic: Secondary | ICD-10-CM | POA: Diagnosis not present

## 2019-05-10 ENCOUNTER — Ambulatory Visit
Admission: RE | Admit: 2019-05-10 | Discharge: 2019-05-10 | Disposition: A | Discharge status: Home / Self Care | Payer: Medicare Other | Source: Ambulatory Visit | Attending: Family | Admitting: Family

## 2019-05-10 DIAGNOSIS — M48061 Spinal stenosis, lumbar region without neurogenic claudication: Secondary | ICD-10-CM | POA: Diagnosis not present

## 2019-05-10 DIAGNOSIS — M545 Low back pain, unspecified: Secondary | ICD-10-CM

## 2019-05-10 DIAGNOSIS — G8929 Other chronic pain: Secondary | ICD-10-CM

## 2019-05-10 DIAGNOSIS — M5136 Other intervertebral disc degeneration, lumbar region: Secondary | ICD-10-CM

## 2019-05-10 DIAGNOSIS — M810 Age-related osteoporosis without current pathological fracture: Secondary | ICD-10-CM

## 2019-05-16 DIAGNOSIS — Z79891 Long term (current) use of opiate analgesic: Secondary | ICD-10-CM | POA: Diagnosis not present

## 2019-05-16 DIAGNOSIS — M545 Low back pain: Secondary | ICD-10-CM | POA: Diagnosis not present

## 2019-05-16 DIAGNOSIS — M5136 Other intervertebral disc degeneration, lumbar region: Secondary | ICD-10-CM | POA: Diagnosis not present

## 2019-05-16 DIAGNOSIS — M25561 Pain in right knee: Secondary | ICD-10-CM | POA: Diagnosis not present

## 2019-05-16 DIAGNOSIS — G894 Chronic pain syndrome: Secondary | ICD-10-CM | POA: Diagnosis not present

## 2019-05-27 ENCOUNTER — Ambulatory Visit (INDEPENDENT_AMBULATORY_CARE_PROVIDER_SITE_OTHER)
Admission: RE | Admit: 2019-05-27 | Discharge: 2019-05-27 | Disposition: A | Discharge status: Home / Self Care | Payer: Medicare Other | Source: Ambulatory Visit | Attending: Acute Care | Admitting: Acute Care

## 2019-05-27 ENCOUNTER — Other Ambulatory Visit: Payer: Self-pay

## 2019-05-27 DIAGNOSIS — F17210 Nicotine dependence, cigarettes, uncomplicated: Secondary | ICD-10-CM

## 2019-05-27 DIAGNOSIS — Z87891 Personal history of nicotine dependence: Secondary | ICD-10-CM | POA: Diagnosis not present

## 2019-05-27 DIAGNOSIS — Z122 Encounter for screening for malignant neoplasm of respiratory organs: Secondary | ICD-10-CM

## 2019-06-01 ENCOUNTER — Other Ambulatory Visit: Payer: Self-pay | Admitting: *Deleted

## 2019-06-01 DIAGNOSIS — Z87891 Personal history of nicotine dependence: Secondary | ICD-10-CM

## 2019-06-01 DIAGNOSIS — F17210 Nicotine dependence, cigarettes, uncomplicated: Secondary | ICD-10-CM

## 2019-06-01 DIAGNOSIS — Z122 Encounter for screening for malignant neoplasm of respiratory organs: Secondary | ICD-10-CM

## 2019-06-15 DIAGNOSIS — G894 Chronic pain syndrome: Secondary | ICD-10-CM | POA: Diagnosis not present

## 2019-06-15 DIAGNOSIS — M545 Low back pain: Secondary | ICD-10-CM | POA: Diagnosis not present

## 2019-06-15 DIAGNOSIS — M5136 Other intervertebral disc degeneration, lumbar region: Secondary | ICD-10-CM | POA: Diagnosis not present

## 2019-06-15 DIAGNOSIS — M25561 Pain in right knee: Secondary | ICD-10-CM | POA: Diagnosis not present

## 2019-06-15 DIAGNOSIS — Z79891 Long term (current) use of opiate analgesic: Secondary | ICD-10-CM | POA: Diagnosis not present

## 2019-07-08 ENCOUNTER — Other Ambulatory Visit: Payer: Self-pay | Admitting: Internal Medicine

## 2019-07-08 DIAGNOSIS — E785 Hyperlipidemia, unspecified: Secondary | ICD-10-CM

## 2019-07-08 NOTE — Telephone Encounter (Signed)
Mickel Baas patient

## 2019-07-13 DIAGNOSIS — M545 Low back pain: Secondary | ICD-10-CM | POA: Diagnosis not present

## 2019-07-13 DIAGNOSIS — M5136 Other intervertebral disc degeneration, lumbar region: Secondary | ICD-10-CM | POA: Diagnosis not present

## 2019-07-13 DIAGNOSIS — M25561 Pain in right knee: Secondary | ICD-10-CM | POA: Diagnosis not present

## 2019-07-13 DIAGNOSIS — G894 Chronic pain syndrome: Secondary | ICD-10-CM | POA: Diagnosis not present

## 2019-07-13 DIAGNOSIS — Z79891 Long term (current) use of opiate analgesic: Secondary | ICD-10-CM | POA: Diagnosis not present

## 2019-08-10 DIAGNOSIS — M5136 Other intervertebral disc degeneration, lumbar region: Secondary | ICD-10-CM | POA: Diagnosis not present

## 2019-08-10 DIAGNOSIS — M25561 Pain in right knee: Secondary | ICD-10-CM | POA: Diagnosis not present

## 2019-08-10 DIAGNOSIS — M545 Low back pain: Secondary | ICD-10-CM | POA: Diagnosis not present

## 2019-08-10 DIAGNOSIS — Z79891 Long term (current) use of opiate analgesic: Secondary | ICD-10-CM | POA: Diagnosis not present

## 2019-08-10 DIAGNOSIS — G894 Chronic pain syndrome: Secondary | ICD-10-CM | POA: Diagnosis not present

## 2019-09-05 ENCOUNTER — Encounter: Payer: Medicare Other | Admitting: Family

## 2019-09-05 ENCOUNTER — Ambulatory Visit: Payer: Medicare Other | Admitting: Internal Medicine

## 2019-09-06 ENCOUNTER — Encounter: Payer: Self-pay | Admitting: Gastroenterology

## 2019-09-06 ENCOUNTER — Other Ambulatory Visit: Payer: Self-pay

## 2019-09-06 ENCOUNTER — Other Ambulatory Visit (INDEPENDENT_AMBULATORY_CARE_PROVIDER_SITE_OTHER): Payer: Medicare Other

## 2019-09-06 ENCOUNTER — Ambulatory Visit (INDEPENDENT_AMBULATORY_CARE_PROVIDER_SITE_OTHER): Payer: Medicare Other | Admitting: Family

## 2019-09-06 ENCOUNTER — Encounter: Payer: Self-pay | Admitting: Family

## 2019-09-06 ENCOUNTER — Ambulatory Visit: Payer: Medicare Other | Admitting: Family

## 2019-09-06 VITALS — BP 128/72 | HR 87 | Temp 98.6°F | Ht 66.0 in | Wt 188.6 lb

## 2019-09-06 DIAGNOSIS — R200 Anesthesia of skin: Secondary | ICD-10-CM

## 2019-09-06 DIAGNOSIS — R202 Paresthesia of skin: Secondary | ICD-10-CM | POA: Diagnosis not present

## 2019-09-06 DIAGNOSIS — E785 Hyperlipidemia, unspecified: Secondary | ICD-10-CM | POA: Diagnosis not present

## 2019-09-06 DIAGNOSIS — R7303 Prediabetes: Secondary | ICD-10-CM

## 2019-09-06 DIAGNOSIS — M79673 Pain in unspecified foot: Secondary | ICD-10-CM | POA: Diagnosis not present

## 2019-09-06 DIAGNOSIS — Z1211 Encounter for screening for malignant neoplasm of colon: Secondary | ICD-10-CM | POA: Diagnosis not present

## 2019-09-06 DIAGNOSIS — Z23 Encounter for immunization: Secondary | ICD-10-CM | POA: Diagnosis not present

## 2019-09-06 DIAGNOSIS — M810 Age-related osteoporosis without current pathological fracture: Secondary | ICD-10-CM

## 2019-09-06 LAB — COMPREHENSIVE METABOLIC PANEL
ALT: 15 U/L (ref 0–35)
AST: 18 U/L (ref 0–37)
Albumin: 4 g/dL (ref 3.5–5.2)
Alkaline Phosphatase: 59 U/L (ref 39–117)
BUN: 13 mg/dL (ref 6–23)
CO2: 29 mEq/L (ref 19–32)
Calcium: 9.1 mg/dL (ref 8.4–10.5)
Chloride: 107 mEq/L (ref 96–112)
Creatinine, Ser: 0.92 mg/dL (ref 0.40–1.20)
GFR: 73.13 mL/min (ref >60.00)
Glucose, Bld: 102 mg/dL — ABNORMAL HIGH (ref 70–99)
Potassium: 4 mEq/L (ref 3.5–5.1)
Sodium: 141 mEq/L (ref 135–145)
Total Bilirubin: 0.3 mg/dL (ref 0.2–1.2)
Total Protein: 6.2 g/dL (ref 6.0–8.3)

## 2019-09-06 LAB — LIPID PANEL
Cholesterol: 133 mg/dL (ref 0–200)
HDL: 52.3 mg/dL (ref >39.00)
LDL Cholesterol: 62 mg/dL (ref 0–99)
NonHDL: 80.98
Total CHOL/HDL Ratio: 3
Triglycerides: 93 mg/dL (ref 0.0–149.0)
VLDL: 18.6 mg/dL (ref 0.0–40.0)

## 2019-09-06 LAB — URIC ACID: Uric Acid, Serum: 4.8 mg/dL (ref 2.4–7.0)

## 2019-09-06 LAB — VITAMIN B12: Vitamin B-12: 475 pg/mL (ref 211–911)

## 2019-09-06 LAB — HEMOGLOBIN A1C: Hgb A1c MFr Bld: 6.1 % (ref 4.6–6.5)

## 2019-09-06 MED ORDER — EZETIMIBE-SIMVASTATIN 10-20 MG PO TABS: 1.0000 | ORAL_TABLET | Freq: Every day | ORAL | 3 refills | Status: AC

## 2019-09-06 NOTE — Addendum Note (Signed)
Addended by: Marcina Millard on: 09/06/2019 03:35 PM   Modules accepted: Orders
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
## 2019-09-07 ENCOUNTER — Other Ambulatory Visit: Payer: Self-pay | Admitting: Family

## 2019-09-07 DIAGNOSIS — M25561 Pain in right knee: Secondary | ICD-10-CM | POA: Diagnosis not present

## 2019-09-07 DIAGNOSIS — G894 Chronic pain syndrome: Secondary | ICD-10-CM | POA: Diagnosis not present

## 2019-09-07 DIAGNOSIS — Z79891 Long term (current) use of opiate analgesic: Secondary | ICD-10-CM | POA: Diagnosis not present

## 2019-09-07 DIAGNOSIS — M5136 Other intervertebral disc degeneration, lumbar region: Secondary | ICD-10-CM | POA: Diagnosis not present

## 2019-09-07 DIAGNOSIS — M545 Low back pain: Secondary | ICD-10-CM | POA: Diagnosis not present

## 2019-09-07 MED ORDER — GABAPENTIN 100 MG PO CAPS: 200.0000 mg | ORAL_CAPSULE | Freq: Every day | ORAL | 0 refills | Status: DC

## 2019-09-13 ENCOUNTER — Telehealth: Payer: Self-pay | Admitting: Family

## 2019-09-13 NOTE — Telephone Encounter (Signed)
Insurance has been submitted and verified for Prolia. Patient is responsible for a $0 copay if done through the pharmacy. Due 09/29/2019. (she will need to pick it up and bring it to the office)  Can this be sent to the pharmacy for her please?

## 2019-09-14 MED ORDER — DENOSUMAB 60 MG/ML ~~LOC~~ SOSY: 60.0000 mg | PREFILLED_SYRINGE | Freq: Once | SUBCUTANEOUS | 0 refills | Status: AC

## 2019-09-14 NOTE — Telephone Encounter (Signed)
rx for prolia sent to pharmacy

## 2019-09-28 ENCOUNTER — Other Ambulatory Visit: Payer: Self-pay

## 2019-09-28 MED ORDER — DENOSUMAB 60 MG/ML ~~LOC~~ SOSY: 60.0000 mg | PREFILLED_SYRINGE | Freq: Once | SUBCUTANEOUS | 0 refills | Status: AC

## 2019-09-29 ENCOUNTER — Ambulatory Visit (AMBULATORY_SURGERY_CENTER): Payer: Medicare Other | Admitting: *Deleted

## 2019-09-29 ENCOUNTER — Other Ambulatory Visit: Payer: Self-pay

## 2019-09-29 VITALS — Temp 96.6°F | Ht 66.0 in | Wt 184.0 lb

## 2019-09-29 DIAGNOSIS — Z1159 Encounter for screening for other viral diseases: Secondary | ICD-10-CM

## 2019-09-29 DIAGNOSIS — Z1211 Encounter for screening for malignant neoplasm of colon: Secondary | ICD-10-CM

## 2019-09-29 MED ORDER — SUPREP BOWEL PREP KIT 17.5-3.13-1.6 GM/177ML PO SOLN: 1.0000 | Freq: Once | ORAL | 0 refills | Status: AC

## 2019-09-29 NOTE — Progress Notes (Signed)

## 2019-10-07 DIAGNOSIS — Z79891 Long term (current) use of opiate analgesic: Secondary | ICD-10-CM | POA: Diagnosis not present

## 2019-10-07 DIAGNOSIS — G894 Chronic pain syndrome: Secondary | ICD-10-CM | POA: Diagnosis not present

## 2019-10-07 DIAGNOSIS — M545 Low back pain: Secondary | ICD-10-CM | POA: Diagnosis not present

## 2019-10-07 DIAGNOSIS — M5136 Other intervertebral disc degeneration, lumbar region: Secondary | ICD-10-CM | POA: Diagnosis not present

## 2019-10-07 DIAGNOSIS — M25561 Pain in right knee: Secondary | ICD-10-CM | POA: Diagnosis not present

## 2019-10-12 ENCOUNTER — Other Ambulatory Visit: Payer: Self-pay | Admitting: Gastroenterology

## 2019-10-12 ENCOUNTER — Ambulatory Visit (INDEPENDENT_AMBULATORY_CARE_PROVIDER_SITE_OTHER): Payer: Medicare Other

## 2019-10-12 DIAGNOSIS — Z1159 Encounter for screening for other viral diseases: Secondary | ICD-10-CM | POA: Diagnosis not present

## 2019-10-13 LAB — SARS CORONAVIRUS 2 (TAT 6-24 HRS): SARS Coronavirus 2: NEGATIVE

## 2019-10-14 ENCOUNTER — Ambulatory Visit (AMBULATORY_SURGERY_CENTER): Payer: Medicare Other | Admitting: Gastroenterology

## 2019-10-14 ENCOUNTER — Encounter: Payer: Self-pay | Admitting: Gastroenterology

## 2019-10-14 ENCOUNTER — Other Ambulatory Visit: Payer: Self-pay

## 2019-10-14 VITALS — BP 142/78 | HR 55 | Temp 98.7°F | Resp 17 | Ht 66.0 in | Wt 184.0 lb

## 2019-10-14 DIAGNOSIS — Z1211 Encounter for screening for malignant neoplasm of colon: Secondary | ICD-10-CM | POA: Diagnosis not present

## 2019-10-14 DIAGNOSIS — D120 Benign neoplasm of cecum: Secondary | ICD-10-CM

## 2019-10-14 MED ORDER — SODIUM CHLORIDE 0.9 % IV SOLN: 500.0000 mL | Freq: Once | INTRAVENOUS | Status: DC

## 2019-10-14 NOTE — Progress Notes (Signed)
Temperature- Veronica Spence  Pt's states no medical or surgical changes since previsit or office visit.

## 2019-10-14 NOTE — Progress Notes (Signed)
Report to PACU, RN, vss, BBS= Clear.  

## 2019-10-14 NOTE — Op Note (Signed)
[place] Patient Name: Veronica Spence Procedure Date: 10/14/2019 10:00 AM MRN: [id number] Endoscopist: Remo Lipps P. Havery Moros , MD Age: 69 Referring MD:  Date of Birth: 06/06/1951 Gender: Female Account #: [id number] Procedure:                Colonoscopy Indications:              Screening for colorectal malignant neoplasm Medicines:                Monitored Anesthesia Care Procedure:                Pre-Anesthesia Assessment:                           - Prior to the procedure, a History and Physical                            was performed, and patient medications and                            allergies were reviewed. The patient's tolerance of                            previous anesthesia was also reviewed. The risks                            and benefits of the procedure and the sedation                            options and risks were discussed with the patient.                            All questions were answered, and informed consent                            was obtained. Prior Anticoagulants: The patient has                            taken no previous anticoagulant or antiplatelet                            agents. ASA Grade Assessment: II - A patient with                            mild systemic disease. After reviewing the risks                            and benefits, the patient was deemed in                            satisfactory condition to undergo the procedure.                           After obtaining informed consent, the colonoscope  was passed under direct vision. Throughout the                            procedure, the patient's blood pressure, pulse, and                            oxygen saturations were monitored continuously. The                            Colonoscope was introduced through the anus and                            advanced to the the cecum, identified by                            appendiceal  orifice and ileocecal valve. The                            colonoscopy was performed without difficulty. The                            patient tolerated the procedure well. The quality                            of the bowel preparation was good. The ileocecal                            valve, appendiceal orifice, and rectum were                            photographed. Scope In: 10:09:42 AM Scope Out: 10:28:07 AM Scope Withdrawal Time: 0 hours 15 minutes 31 seconds  Total Procedure Duration: 0 hours 18 minutes 25 seconds  Findings:                 The perianal and digital rectal examinations were                            normal.                           A 3 mm polyp was found in the cecum. The polyp was                            sessile. The polyp was removed with a cold snare.                            Resection and retrieval were complete.                           Multiple medium-mouthed diverticula were found in                            the left colon.  Internal hemorrhoids were found during retroflexion.                           The exam was otherwise without abnormality. Complications:            No immediate complications. Estimated blood loss:                            Minimal. Estimated Blood Loss:     Estimated blood loss was minimal. Impression:               - One 3 mm polyp in the cecum, removed with a cold                            snare. Resected and retrieved.                           - Diverticulosis in the left colon.                           - Internal hemorrhoids.                           - The examination was otherwise normal. Recommendation:           - Patient has a contact number available for                            emergencies. The signs and symptoms of potential                            delayed complications were discussed with the                            patient. Return to normal activities tomorrow.                             Written discharge instructions were provided to the                            patient.                           - Resume previous diet.                           - Continue present medications.                           - Await pathology results. Remo Lipps P. Percell Lamboy, MD 10/14/2019 10:31:18 AM This report has been signed electronically.

## 2019-10-14 NOTE — Patient Instructions (Signed)
Discharge instructions given. Handouts on polyps,diverticulosis and hemorrhoids. Resume previous medications. YOU HAD AN ENDOSCOPIC PROCEDURE TODAY AT THE [facility]:   Refer to the procedure report that was given to you for any specific questions about what was found during the examination.  If the procedure report does not answer your questions, please call your gastroenterologist to clarify.  If you requested that your care partner not be given the details of your procedure findings, then the procedure report has been included in a sealed envelope for you to review at your convenience later.  YOU SHOULD EXPECT: Some feelings of bloating in the abdomen. Passage of more gas than usual.  Walking can help get rid of the air that was put into your GI tract during the procedure and reduce the bloating. If you had a lower endoscopy (such as a colonoscopy or flexible sigmoidoscopy) you may notice spotting of blood in your stool or on the toilet paper. If you underwent a bowel prep for your procedure, you may not have a normal bowel movement for a few days.  Please Note:  You might notice some irritation and congestion in your nose or some drainage.  This is from the oxygen used during your procedure.  There is no need for concern and it should clear up in a day or so.  SYMPTOMS TO REPORT IMMEDIATELY:   Following lower endoscopy (colonoscopy or flexible sigmoidoscopy):  Excessive amounts of blood in the stool  Significant tenderness or worsening of abdominal pains  Swelling of the abdomen that is new, acute  Fever of 100F or higher  For urgent or emergent issues, a gastroenterologist can be reached at any hour by calling [phone].   DIET:  We do recommend a small meal at first, but then you may proceed to your regular diet.  Drink plenty of fluids but you should avoid alcoholic beverages for 24 hours.  ACTIVITY:  You should plan to take it easy for the rest of today and you  should NOT DRIVE or use heavy machinery until tomorrow (because of the sedation medicines used during the test).    FOLLOW UP: Our staff will call the number listed on your records 48-72 hours following your procedure to check on you and address any questions or concerns that you may have regarding the information given to you following your procedure. If we do not reach you, we will leave a message.  We will attempt to reach you two times.  During this call, we will ask if you have developed any symptoms of COVID 19. If you develop any symptoms (ie: fever, flu-like symptoms, shortness of breath, cough etc.) before then, please call [phone].  If you test positive for Covid 19 in the 2 weeks post procedure, please call and report this information to us.    If any biopsies were taken you will be contacted by phone or by letter within the next 1-3 weeks.  Please call us at [phone] if you have not heard about the biopsies in 3 weeks.    SIGNATURES/CONFIDENTIALITY: You and/or your care partner have signed paperwork which will be entered into your electronic medical record.  These signatures attest to the fact that that the information above on your After Visit Summary has been reviewed and is understood.  Full responsibility of the confidentiality of this discharge information lies with you and/or your care-partner. 

## 2019-10-14 NOTE — Progress Notes (Signed)
Called to room to assist during endoscopic procedure.  Patient ID and intended procedure confirmed with present staff. Received instructions for my participation in the procedure from the performing physician.  

## 2019-10-18 ENCOUNTER — Telehealth: Payer: Self-pay

## 2019-10-18 NOTE — Telephone Encounter (Signed)
  Follow up Call-  Call back number 10/14/2019  Post procedure Call Back phone  # [phone]  Permission to leave phone message Yes  Some recent data might be hidden     Patient questions:  Do you have a fever, pain , or abdominal swelling? No. Pain Score  0 *  Have you tolerated food without any problems? Yes.    Have you been able to return to your normal activities? Yes.    Do you have any questions about your discharge instructions: Diet   No. Medications  No. Follow up visit  No.  Do you have questions or concerns about your Care? No.  Actions: * If pain score is 4 or above: No action needed, pain <4.  1. Have you developed a fever since your procedure? no  2.   Have you had an respiratory symptoms (SOB or cough) since your procedure? no  3.   Have you tested positive for COVID 19 since your procedure no  4.   Have you had any family members/close contacts diagnosed with the COVID 19 since your procedure? no   If yes to any of these questions please route to Joylene John, RN and Alphonsa Gin, Therapist, sports.

## 2019-10-19 ENCOUNTER — Encounter: Payer: Self-pay | Admitting: Gastroenterology

## 2019-10-19 DIAGNOSIS — M545 Low back pain: Secondary | ICD-10-CM | POA: Diagnosis not present

## 2019-10-19 DIAGNOSIS — M791 Myalgia, unspecified site: Secondary | ICD-10-CM | POA: Diagnosis not present

## 2019-10-31 ENCOUNTER — Encounter: Payer: Self-pay | Admitting: Family

## 2019-10-31 ENCOUNTER — Other Ambulatory Visit: Payer: Self-pay

## 2019-10-31 ENCOUNTER — Ambulatory Visit (INDEPENDENT_AMBULATORY_CARE_PROVIDER_SITE_OTHER): Payer: Medicare Other | Admitting: Family

## 2019-10-31 VITALS — BP 148/84 | HR 94 | Temp 98.0°F | Ht 66.0 in | Wt 185.6 lb

## 2019-10-31 DIAGNOSIS — R1032 Left lower quadrant pain: Secondary | ICD-10-CM

## 2019-10-31 LAB — CBC WITH DIFFERENTIAL/PLATELET
Basophils Absolute: 0 10*3/uL (ref 0.0–0.1)
Basophils Relative: 0.4 % (ref 0.0–3.0)
Eosinophils Absolute: 0.1 10*3/uL (ref 0.0–0.7)
Eosinophils Relative: 1.1 % (ref 0.0–5.0)
HCT: 40.9 % (ref 36.0–46.0)
Hemoglobin: 13 g/dL (ref 12.0–15.0)
Lymphocytes Relative: 34.5 % (ref 12.0–46.0)
Lymphs Abs: 2.4 10*3/uL (ref 0.7–4.0)
MCHC: 31.8 g/dL (ref 30.0–36.0)
MCV: 80 fl (ref 78.0–100.0)
Monocytes Absolute: 0.6 10*3/uL (ref 0.1–1.0)
Monocytes Relative: 8.8 % (ref 3.0–12.0)
Neutro Abs: 3.8 10*3/uL (ref 1.4–7.7)
Neutrophils Relative %: 55.2 % (ref 43.0–77.0)
Platelets: 217 10*3/uL (ref 150.0–400.0)
RBC: 5.11 Mil/uL (ref 3.87–5.11)
RDW: 15.9 % — ABNORMAL HIGH (ref 11.5–15.5)
WBC: 6.9 10*3/uL (ref 4.0–10.5)

## 2019-10-31 LAB — COMPREHENSIVE METABOLIC PANEL
ALT: 17 U/L (ref 0–35)
AST: 17 U/L (ref 0–37)
Albumin: 4.3 g/dL (ref 3.5–5.2)
Alkaline Phosphatase: 55 U/L (ref 39–117)
BUN: 15 mg/dL (ref 6–23)
CO2: 29 mEq/L (ref 19–32)
Calcium: 9.7 mg/dL (ref 8.4–10.5)
Chloride: 104 mEq/L (ref 96–112)
Creatinine, Ser: 1.01 mg/dL (ref 0.40–1.20)
GFR: 65.64 mL/min (ref >60.00)
Glucose, Bld: 77 mg/dL (ref 70–99)
Potassium: 4 mEq/L (ref 3.5–5.1)
Sodium: 139 mEq/L (ref 135–145)
Total Bilirubin: 0.3 mg/dL (ref 0.2–1.2)
Total Protein: 7 g/dL (ref 6.0–8.3)

## 2019-10-31 MED ORDER — METRONIDAZOLE 500 MG PO TABS: 500.0000 mg | ORAL_TABLET | Freq: Three times a day (TID) | ORAL | 0 refills | Status: DC

## 2019-10-31 MED ORDER — CIPROFLOXACIN HCL 500 MG PO TABS: 500.0000 mg | ORAL_TABLET | Freq: Two times a day (BID) | ORAL | 0 refills | Status: DC
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
## 2019-11-01 LAB — URINALYSIS, ROUTINE W REFLEX MICROSCOPIC
Bilirubin Urine: NEGATIVE
Ketones, ur: NEGATIVE
Nitrite: NEGATIVE
Specific Gravity, Urine: >=1.03 — AB (ref 1.000–1.030)
Total Protein, Urine: NEGATIVE
Urine Glucose: NEGATIVE
Urobilinogen, UA: 0.2 (ref 0.0–1.0)
pH: 5.5 (ref 5.0–8.0)

## 2019-11-02 ENCOUNTER — Ambulatory Visit
Admission: RE | Admit: 2019-11-02 | Discharge: 2019-11-02 | Disposition: A | Discharge status: Home / Self Care | Payer: Medicare Other | Source: Ambulatory Visit | Attending: Family | Admitting: Family

## 2019-11-02 ENCOUNTER — Other Ambulatory Visit: Payer: Self-pay | Admitting: Family

## 2019-11-02 DIAGNOSIS — R1032 Left lower quadrant pain: Secondary | ICD-10-CM

## 2019-11-02 DIAGNOSIS — R109 Unspecified abdominal pain: Secondary | ICD-10-CM | POA: Diagnosis not present

## 2019-11-04 DIAGNOSIS — M25561 Pain in right knee: Secondary | ICD-10-CM | POA: Diagnosis not present

## 2019-11-04 DIAGNOSIS — G894 Chronic pain syndrome: Secondary | ICD-10-CM | POA: Diagnosis not present

## 2019-11-04 DIAGNOSIS — M5136 Other intervertebral disc degeneration, lumbar region: Secondary | ICD-10-CM | POA: Diagnosis not present

## 2019-11-04 DIAGNOSIS — Z79891 Long term (current) use of opiate analgesic: Secondary | ICD-10-CM | POA: Diagnosis not present

## 2019-11-04 DIAGNOSIS — M545 Low back pain: Secondary | ICD-10-CM | POA: Diagnosis not present

## 2019-11-22 ENCOUNTER — Telehealth: Payer: Self-pay | Admitting: Family

## 2019-11-22 DIAGNOSIS — M25561 Pain in right knee: Secondary | ICD-10-CM | POA: Diagnosis not present

## 2019-11-22 DIAGNOSIS — Z79891 Long term (current) use of opiate analgesic: Secondary | ICD-10-CM | POA: Diagnosis not present

## 2019-11-22 DIAGNOSIS — G8929 Other chronic pain: Secondary | ICD-10-CM | POA: Diagnosis not present

## 2019-11-22 NOTE — Telephone Encounter (Signed)
I received a call from pt and she is interested in seeing a surgeon regarding her hernia.  Can you please place referral

## 2019-11-23 ENCOUNTER — Other Ambulatory Visit: Payer: Self-pay | Admitting: Family

## 2019-11-23 DIAGNOSIS — K429 Umbilical hernia without obstruction or gangrene: Secondary | ICD-10-CM

## 2019-11-29 DIAGNOSIS — G8929 Other chronic pain: Secondary | ICD-10-CM | POA: Diagnosis not present

## 2019-11-29 DIAGNOSIS — M25561 Pain in right knee: Secondary | ICD-10-CM | POA: Diagnosis not present

## 2019-11-29 DIAGNOSIS — Z79891 Long term (current) use of opiate analgesic: Secondary | ICD-10-CM | POA: Diagnosis not present

## 2019-12-02 DIAGNOSIS — M25561 Pain in right knee: Secondary | ICD-10-CM | POA: Diagnosis not present

## 2019-12-02 DIAGNOSIS — G894 Chronic pain syndrome: Secondary | ICD-10-CM | POA: Diagnosis not present

## 2019-12-02 DIAGNOSIS — M545 Low back pain: Secondary | ICD-10-CM | POA: Diagnosis not present

## 2019-12-02 DIAGNOSIS — M5136 Other intervertebral disc degeneration, lumbar region: Secondary | ICD-10-CM | POA: Diagnosis not present

## 2019-12-02 DIAGNOSIS — Z79891 Long term (current) use of opiate analgesic: Secondary | ICD-10-CM | POA: Diagnosis not present

## 2019-12-20 DIAGNOSIS — G8929 Other chronic pain: Secondary | ICD-10-CM | POA: Diagnosis not present

## 2019-12-20 DIAGNOSIS — Z79891 Long term (current) use of opiate analgesic: Secondary | ICD-10-CM | POA: Diagnosis not present

## 2019-12-20 DIAGNOSIS — M25561 Pain in right knee: Secondary | ICD-10-CM | POA: Diagnosis not present

## 2019-12-30 DIAGNOSIS — G8929 Other chronic pain: Secondary | ICD-10-CM | POA: Diagnosis not present

## 2019-12-30 DIAGNOSIS — Z79891 Long term (current) use of opiate analgesic: Secondary | ICD-10-CM | POA: Diagnosis not present

## 2019-12-30 DIAGNOSIS — M25561 Pain in right knee: Secondary | ICD-10-CM | POA: Diagnosis not present

## 2020-01-02 DIAGNOSIS — M25561 Pain in right knee: Secondary | ICD-10-CM | POA: Diagnosis not present

## 2020-01-02 DIAGNOSIS — G894 Chronic pain syndrome: Secondary | ICD-10-CM | POA: Diagnosis not present

## 2020-01-02 DIAGNOSIS — M5136 Other intervertebral disc degeneration, lumbar region: Secondary | ICD-10-CM | POA: Diagnosis not present

## 2020-01-02 DIAGNOSIS — M545 Low back pain: Secondary | ICD-10-CM | POA: Diagnosis not present

## 2020-01-02 DIAGNOSIS — Z79891 Long term (current) use of opiate analgesic: Secondary | ICD-10-CM | POA: Diagnosis not present

## 2020-01-14 ENCOUNTER — Other Ambulatory Visit: Payer: Self-pay

## 2020-01-14 ENCOUNTER — Encounter (HOSPITAL_COMMUNITY): Payer: Self-pay

## 2020-01-14 ENCOUNTER — Ambulatory Visit (HOSPITAL_COMMUNITY)
Admission: EM | Admit: 2020-01-14 | Discharge: 2020-01-14 | Disposition: A | Discharge status: Home / Self Care | Payer: Medicare Other | Attending: Physician Assistant | Admitting: Physician Assistant

## 2020-01-14 DIAGNOSIS — M79604 Pain in right leg: Secondary | ICD-10-CM | POA: Diagnosis not present

## 2020-01-14 DIAGNOSIS — M5137 Other intervertebral disc degeneration, lumbosacral region: Secondary | ICD-10-CM | POA: Diagnosis not present

## 2020-01-14 DIAGNOSIS — M79605 Pain in left leg: Secondary | ICD-10-CM

## 2020-01-14 MED ORDER — PREDNISONE 10 MG (21) PO TBPK: ORAL_TABLET | Freq: Every day | ORAL | 0 refills | Status: DC
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
## 2020-01-14 NOTE — ED Triage Notes (Signed)
Pt present pain in both legs, the pain is more in her thighs area. Pain started over a week ago. Pt has tried otc medication with no relief.

## 2020-01-14 NOTE — Discharge Instructions (Addendum)
This is most likely from arthritis in your back pressing on nerves or your spinal cord which can give you leg pain. Treat with prednisone and hopefully this will help. Suggest f/u with a spine specialist if this continues. Feel better.

## 2020-01-17 ENCOUNTER — Other Ambulatory Visit: Payer: Self-pay | Admitting: Family

## 2020-01-20 DIAGNOSIS — G8929 Other chronic pain: Secondary | ICD-10-CM | POA: Diagnosis not present

## 2020-01-20 DIAGNOSIS — M545 Low back pain: Secondary | ICD-10-CM | POA: Diagnosis not present

## 2020-01-20 DIAGNOSIS — Z79891 Long term (current) use of opiate analgesic: Secondary | ICD-10-CM | POA: Diagnosis not present

## 2020-01-20 DIAGNOSIS — M25561 Pain in right knee: Secondary | ICD-10-CM | POA: Diagnosis not present

## 2020-01-23 DIAGNOSIS — K429 Umbilical hernia without obstruction or gangrene: Secondary | ICD-10-CM | POA: Diagnosis not present

## 2020-01-29 DIAGNOSIS — G8929 Other chronic pain: Secondary | ICD-10-CM | POA: Diagnosis not present

## 2020-01-29 DIAGNOSIS — Z79891 Long term (current) use of opiate analgesic: Secondary | ICD-10-CM | POA: Diagnosis not present

## 2020-01-29 DIAGNOSIS — M25561 Pain in right knee: Secondary | ICD-10-CM | POA: Diagnosis not present

## 2020-01-30 DIAGNOSIS — M5136 Other intervertebral disc degeneration, lumbar region: Secondary | ICD-10-CM | POA: Diagnosis not present

## 2020-02-03 ENCOUNTER — Other Ambulatory Visit: Payer: Self-pay | Admitting: Family

## 2020-02-13 DIAGNOSIS — M5136 Other intervertebral disc degeneration, lumbar region: Secondary | ICD-10-CM | POA: Diagnosis not present

## 2020-02-19 DIAGNOSIS — M25561 Pain in right knee: Secondary | ICD-10-CM | POA: Diagnosis not present

## 2020-02-19 DIAGNOSIS — G8929 Other chronic pain: Secondary | ICD-10-CM | POA: Diagnosis not present

## 2020-02-19 DIAGNOSIS — Z79891 Long term (current) use of opiate analgesic: Secondary | ICD-10-CM | POA: Diagnosis not present

## 2020-02-29 DIAGNOSIS — G8929 Other chronic pain: Secondary | ICD-10-CM | POA: Diagnosis not present

## 2020-02-29 DIAGNOSIS — Z79891 Long term (current) use of opiate analgesic: Secondary | ICD-10-CM | POA: Diagnosis not present

## 2020-02-29 DIAGNOSIS — M25561 Pain in right knee: Secondary | ICD-10-CM | POA: Diagnosis not present

## 2020-03-01 DIAGNOSIS — M5136 Other intervertebral disc degeneration, lumbar region: Secondary | ICD-10-CM | POA: Diagnosis not present

## 2020-03-05 DIAGNOSIS — Z79899 Other long term (current) drug therapy: Secondary | ICD-10-CM | POA: Diagnosis not present

## 2020-03-05 DIAGNOSIS — M545 Low back pain: Secondary | ICD-10-CM | POA: Diagnosis not present

## 2020-03-12 ENCOUNTER — Ambulatory Visit: Payer: Medicare Other | Admitting: Family

## 2020-03-15 DIAGNOSIS — M5136 Other intervertebral disc degeneration, lumbar region: Secondary | ICD-10-CM | POA: Diagnosis not present

## 2020-03-19 ENCOUNTER — Other Ambulatory Visit: Payer: Self-pay

## 2020-03-19 ENCOUNTER — Encounter: Payer: Self-pay | Admitting: Family

## 2020-03-19 ENCOUNTER — Ambulatory Visit (INDEPENDENT_AMBULATORY_CARE_PROVIDER_SITE_OTHER): Payer: Medicare Other | Admitting: Family

## 2020-03-19 VITALS — BP 148/82 | HR 75 | Temp 98.0°F | Ht 66.0 in | Wt 190.2 lb

## 2020-03-19 DIAGNOSIS — M810 Age-related osteoporosis without current pathological fracture: Secondary | ICD-10-CM | POA: Diagnosis not present

## 2020-03-19 DIAGNOSIS — R350 Frequency of micturition: Secondary | ICD-10-CM

## 2020-03-19 DIAGNOSIS — R7303 Prediabetes: Secondary | ICD-10-CM | POA: Diagnosis not present

## 2020-03-19 DIAGNOSIS — Z720 Tobacco use: Secondary | ICD-10-CM | POA: Diagnosis not present

## 2020-03-19 LAB — COMPREHENSIVE METABOLIC PANEL
ALT: 21 U/L (ref 0–35)
AST: 20 U/L (ref 0–37)
Albumin: 4.2 g/dL (ref 3.5–5.2)
Alkaline Phosphatase: 59 U/L (ref 39–117)
BUN: 13 mg/dL (ref 6–23)
CO2: 28 mEq/L (ref 19–32)
Calcium: 9.7 mg/dL (ref 8.4–10.5)
Chloride: 106 mEq/L (ref 96–112)
Creatinine, Ser: 0.94 mg/dL (ref 0.40–1.20)
GFR: 71.23 mL/min (ref >60.00)
Glucose, Bld: 89 mg/dL (ref 70–99)
Potassium: 4.5 mEq/L (ref 3.5–5.1)
Sodium: 140 mEq/L (ref 135–145)
Total Bilirubin: 0.4 mg/dL (ref 0.2–1.2)
Total Protein: 6.4 g/dL (ref 6.0–8.3)

## 2020-03-19 LAB — POC URINALSYSI DIPSTICK (AUTOMATED)
Bilirubin, UA: NEGATIVE
Glucose, UA: NEGATIVE
Ketones, UA: NEGATIVE
Leukocytes, UA: NEGATIVE
Nitrite, UA: NEGATIVE
Protein, UA: NEGATIVE
Spec Grav, UA: 1.025 (ref 1.010–1.025)
Urobilinogen, UA: 0.2 E.U./dL
pH, UA: 5.5 (ref 5.0–8.0)

## 2020-03-19 LAB — HEMOGLOBIN A1C: Hgb A1c MFr Bld: 6.1 % (ref 4.6–6.5)

## 2020-03-19 MED ORDER — SULFAMETHOXAZOLE-TRIMETHOPRIM 800-160 MG PO TABS
1.0000 | ORAL_TABLET | Freq: Two times a day (BID) | ORAL | 0 refills | Status: DC
Start: 2020-03-19 — End: 2020-03-21
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
## 2020-03-19 NOTE — Addendum Note (Signed)
Addended by: Marcina Millard on: 03/19/2020 04:25 PM   Modules accepted: Orders

## 2020-03-20 LAB — URINE CULTURE: Result:: NO GROWTH

## 2020-03-21 ENCOUNTER — Telehealth: Payer: Self-pay | Admitting: Family

## 2020-03-21 DIAGNOSIS — Z79891 Long term (current) use of opiate analgesic: Secondary | ICD-10-CM | POA: Diagnosis not present

## 2020-03-21 DIAGNOSIS — G8929 Other chronic pain: Secondary | ICD-10-CM | POA: Diagnosis not present

## 2020-03-21 DIAGNOSIS — M25561 Pain in right knee: Secondary | ICD-10-CM | POA: Diagnosis not present

## 2020-03-21 NOTE — Telephone Encounter (Signed)
Notified pt w/Laura response../lmb °

## 2020-03-21 NOTE — Telephone Encounter (Signed)
Her pharmacy contacted [place] to let us know that they have Sulfa drugs listed as an allergy. We did not have that and will update in our system. If she has any other allergies to medications, we need to know this.  Her urine culture did not show an infection so she does not need an antibiotic at this time.  Her pre-diabetes is stable.

## 2020-03-21 NOTE — Progress Notes (Signed)
Patient notified of normal results in another result noted;

## 2020-03-28 ENCOUNTER — Ambulatory Visit (INDEPENDENT_AMBULATORY_CARE_PROVIDER_SITE_OTHER)
Admission: RE | Admit: 2020-03-28 | Discharge: 2020-03-28 | Disposition: A | Discharge status: Home / Self Care | Payer: Medicare Other | Source: Ambulatory Visit | Attending: Family | Admitting: Family

## 2020-03-28 ENCOUNTER — Other Ambulatory Visit: Payer: Self-pay

## 2020-03-28 DIAGNOSIS — M810 Age-related osteoporosis without current pathological fracture: Secondary | ICD-10-CM | POA: Diagnosis not present

## 2020-03-30 DIAGNOSIS — M25561 Pain in right knee: Secondary | ICD-10-CM | POA: Diagnosis not present

## 2020-03-30 DIAGNOSIS — Z79891 Long term (current) use of opiate analgesic: Secondary | ICD-10-CM | POA: Diagnosis not present

## 2020-03-30 DIAGNOSIS — G8929 Other chronic pain: Secondary | ICD-10-CM | POA: Diagnosis not present

## 2020-04-03 DIAGNOSIS — M545 Low back pain: Secondary | ICD-10-CM | POA: Diagnosis not present

## 2020-04-03 DIAGNOSIS — Z79899 Other long term (current) drug therapy: Secondary | ICD-10-CM | POA: Diagnosis not present

## 2020-04-17 DIAGNOSIS — M791 Myalgia, unspecified site: Secondary | ICD-10-CM | POA: Diagnosis not present

## 2020-04-20 DIAGNOSIS — Z79891 Long term (current) use of opiate analgesic: Secondary | ICD-10-CM | POA: Diagnosis not present

## 2020-04-20 DIAGNOSIS — M25561 Pain in right knee: Secondary | ICD-10-CM | POA: Diagnosis not present

## 2020-04-20 DIAGNOSIS — G8929 Other chronic pain: Secondary | ICD-10-CM | POA: Diagnosis not present

## 2020-04-30 DIAGNOSIS — M545 Low back pain: Secondary | ICD-10-CM | POA: Diagnosis not present

## 2020-04-30 DIAGNOSIS — Z79899 Other long term (current) drug therapy: Secondary | ICD-10-CM | POA: Diagnosis not present

## 2020-05-14 DIAGNOSIS — M25561 Pain in right knee: Secondary | ICD-10-CM | POA: Diagnosis not present

## 2020-05-14 DIAGNOSIS — Z79891 Long term (current) use of opiate analgesic: Secondary | ICD-10-CM | POA: Diagnosis not present

## 2020-05-14 DIAGNOSIS — G8929 Other chronic pain: Secondary | ICD-10-CM | POA: Diagnosis not present

## 2020-05-21 ENCOUNTER — Ambulatory Visit
Admission: RE | Admit: 2020-05-21 | Discharge: 2020-05-21 | Disposition: A | Discharge status: Home / Self Care | Payer: Medicare Other | Source: Ambulatory Visit | Attending: Acute Care | Admitting: Acute Care

## 2020-05-21 DIAGNOSIS — Z87891 Personal history of nicotine dependence: Secondary | ICD-10-CM

## 2020-05-21 DIAGNOSIS — F17210 Nicotine dependence, cigarettes, uncomplicated: Secondary | ICD-10-CM | POA: Diagnosis not present

## 2020-05-21 DIAGNOSIS — Z122 Encounter for screening for malignant neoplasm of respiratory organs: Secondary | ICD-10-CM

## 2020-05-24 NOTE — Progress Notes (Signed)
Please call patient and let them  know their  low dose Ct was read as a Lung RADS 1, negative study: no nodules or definitely benign nodules. Radiology recommendation is for a repeat LDCT in 12 months. Please let them  know we will order and schedule their  annual screening scan for 04/2021. Please let them  know there was notation of CAD on their  scan.  Please remind the patient  that this is a non-gated exam therefore degree or severity of disease  cannot be determined. Please have them  follow up with their PCP regarding potential risk factor modification, dietary therapy or pharmacologic therapy if clinically indicated. Pt.  is  currently on statin therapy. Please place order for annual  screening scan for  04/2021 and fax results to PCP. Thanks so much.

## 2020-05-29 DIAGNOSIS — Z79899 Other long term (current) drug therapy: Secondary | ICD-10-CM | POA: Diagnosis not present

## 2020-05-29 DIAGNOSIS — M545 Low back pain: Secondary | ICD-10-CM | POA: Diagnosis not present

## 2020-05-30 ENCOUNTER — Other Ambulatory Visit: Payer: Self-pay | Admitting: *Deleted

## 2020-05-30 DIAGNOSIS — F17210 Nicotine dependence, cigarettes, uncomplicated: Secondary | ICD-10-CM

## 2020-05-30 DIAGNOSIS — Z87891 Personal history of nicotine dependence: Secondary | ICD-10-CM

## 2020-07-02 DIAGNOSIS — R300 Dysuria: Secondary | ICD-10-CM | POA: Diagnosis not present

## 2020-07-02 DIAGNOSIS — R82998 Other abnormal findings in urine: Secondary | ICD-10-CM | POA: Diagnosis not present

## 2020-07-02 DIAGNOSIS — M545 Low back pain, unspecified: Secondary | ICD-10-CM | POA: Diagnosis not present

## 2020-07-02 DIAGNOSIS — F17200 Nicotine dependence, unspecified, uncomplicated: Secondary | ICD-10-CM | POA: Diagnosis not present

## 2020-07-02 DIAGNOSIS — Z79899 Other long term (current) drug therapy: Secondary | ICD-10-CM | POA: Diagnosis not present

## 2020-07-07 ENCOUNTER — Ambulatory Visit
Admission: EM | Admit: 2020-07-07 | Discharge: 2020-07-07 | Disposition: A | Discharge status: Home / Self Care | Payer: Medicare Other | Attending: Emergency Medicine | Admitting: Emergency Medicine

## 2020-07-07 ENCOUNTER — Encounter: Payer: Self-pay | Admitting: Emergency Medicine

## 2020-07-07 ENCOUNTER — Other Ambulatory Visit: Payer: Self-pay

## 2020-07-07 DIAGNOSIS — M5441 Lumbago with sciatica, right side: Secondary | ICD-10-CM | POA: Diagnosis not present

## 2020-07-07 DIAGNOSIS — M5442 Lumbago with sciatica, left side: Secondary | ICD-10-CM

## 2020-07-07 MED ORDER — METHYLPREDNISOLONE SODIUM SUCC 125 MG IJ SOLR
125.0000 mg | Freq: Once | INTRAMUSCULAR | Status: AC
  Administered 2020-07-07: 125 mg via INTRAMUSCULAR

## 2020-07-07 MED ORDER — TIZANIDINE HCL 4 MG PO TABS
2.0000 mg | ORAL_TABLET | Freq: Three times a day (TID) | ORAL | 0 refills | Status: AC | PRN
Start: 2020-07-07 — End: 2020-07-12

## 2020-07-07 NOTE — ED Triage Notes (Signed)
Pt sts bilateral hip pain with pain around to front of her legs upon waking this am; pt denies injury

## 2020-07-07 NOTE — Discharge Instructions (Addendum)
RICE: rest, ice, compression, elevation as needed for pain.    Heat therapy (hot compress, warm wash rag, hot showers, etc.) can help relax muscles and soothe muscle aches. Cold therapy (ice packs) can be used to help swelling both after injury and after prolonged use of areas of chronic pain/aches.  Pain medication:  350 mg-1000 mg of Tylenol (acetaminophen) and/or 200 mg - 800 mg of Advil (ibuprofen, Motrin) every 8 hours as needed.  May alternate between the two throughout the day as they are generally safe to take together.  DO NOT exceed more than 3000 mg of Tylenol or 3200 mg of ibuprofen in a 24 hour period as this could damage your stomach, kidneys, liver, or increase your bleeding risk.   May take muscle relaxer as needed for severe pain / spasm.  (This medication may cause you to become tired so it is important you do not drink alcohol or operate heavy machinery while on this medication.  Recommend your first dose to be taken before bedtime to monitor for side effects safely)  Important to follow up with specialist(s) below for further evaluation/management if your symptoms persist or worsen.
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
## 2020-07-12 ENCOUNTER — Other Ambulatory Visit: Payer: Self-pay | Admitting: Nurse Practitioner

## 2020-07-12 ENCOUNTER — Other Ambulatory Visit: Payer: Self-pay | Admitting: Nutrition

## 2020-07-12 DIAGNOSIS — Z1231 Encounter for screening mammogram for malignant neoplasm of breast: Secondary | ICD-10-CM

## 2020-07-13 ENCOUNTER — Other Ambulatory Visit: Payer: Self-pay

## 2020-07-13 ENCOUNTER — Ambulatory Visit (INDEPENDENT_AMBULATORY_CARE_PROVIDER_SITE_OTHER): Payer: Medicare Other | Admitting: Family

## 2020-07-13 VITALS — BP 140/72 | HR 79 | Temp 98.1°F | Ht 66.0 in | Wt 190.0 lb

## 2020-07-13 DIAGNOSIS — M549 Dorsalgia, unspecified: Secondary | ICD-10-CM

## 2020-07-13 DIAGNOSIS — M810 Age-related osteoporosis without current pathological fracture: Secondary | ICD-10-CM | POA: Diagnosis not present

## 2020-07-13 DIAGNOSIS — G8929 Other chronic pain: Secondary | ICD-10-CM | POA: Diagnosis not present

## 2020-07-13 MED ORDER — METHYLPREDNISOLONE ACETATE 40 MG/ML IJ SUSP
40.0000 mg | Freq: Once | INTRAMUSCULAR | Status: AC
  Administered 2020-07-13: 40 mg via INTRAMUSCULAR
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
## 2020-07-30 DIAGNOSIS — M5136 Other intervertebral disc degeneration, lumbar region: Secondary | ICD-10-CM | POA: Diagnosis not present

## 2020-07-30 DIAGNOSIS — M4310 Spondylolisthesis, site unspecified: Secondary | ICD-10-CM | POA: Diagnosis not present

## 2020-07-30 DIAGNOSIS — M48062 Spinal stenosis, lumbar region with neurogenic claudication: Secondary | ICD-10-CM | POA: Diagnosis not present

## 2020-07-31 DIAGNOSIS — F17200 Nicotine dependence, unspecified, uncomplicated: Secondary | ICD-10-CM | POA: Diagnosis not present

## 2020-07-31 DIAGNOSIS — M545 Low back pain, unspecified: Secondary | ICD-10-CM | POA: Diagnosis not present

## 2020-07-31 DIAGNOSIS — Z79899 Other long term (current) drug therapy: Secondary | ICD-10-CM | POA: Diagnosis not present

## 2020-08-07 ENCOUNTER — Other Ambulatory Visit: Payer: Self-pay

## 2020-08-07 ENCOUNTER — Ambulatory Visit
Admission: RE | Admit: 2020-08-07 | Discharge: 2020-08-07 | Disposition: A | Discharge status: Home / Self Care | Payer: Medicare Other | Source: Ambulatory Visit | Attending: *Deleted | Admitting: *Deleted

## 2020-08-07 DIAGNOSIS — Z1231 Encounter for screening mammogram for malignant neoplasm of breast: Secondary | ICD-10-CM | POA: Diagnosis not present

## 2020-08-15 DIAGNOSIS — M545 Low back pain, unspecified: Secondary | ICD-10-CM | POA: Diagnosis not present

## 2020-08-15 DIAGNOSIS — M25552 Pain in left hip: Secondary | ICD-10-CM | POA: Diagnosis not present

## 2020-08-20 DIAGNOSIS — M5136 Other intervertebral disc degeneration, lumbar region: Secondary | ICD-10-CM | POA: Diagnosis not present

## 2020-08-20 DIAGNOSIS — M4310 Spondylolisthesis, site unspecified: Secondary | ICD-10-CM | POA: Diagnosis not present

## 2020-08-28 DIAGNOSIS — F17200 Nicotine dependence, unspecified, uncomplicated: Secondary | ICD-10-CM | POA: Diagnosis not present

## 2020-08-28 DIAGNOSIS — Z79899 Other long term (current) drug therapy: Secondary | ICD-10-CM | POA: Diagnosis not present

## 2020-08-28 DIAGNOSIS — M545 Low back pain, unspecified: Secondary | ICD-10-CM | POA: Diagnosis not present

## 2020-08-29 DIAGNOSIS — M545 Low back pain, unspecified: Secondary | ICD-10-CM | POA: Diagnosis not present

## 2020-08-29 DIAGNOSIS — M25552 Pain in left hip: Secondary | ICD-10-CM | POA: Diagnosis not present

## 2020-09-03 DIAGNOSIS — M545 Low back pain, unspecified: Secondary | ICD-10-CM | POA: Diagnosis not present

## 2020-09-03 DIAGNOSIS — M25552 Pain in left hip: Secondary | ICD-10-CM | POA: Diagnosis not present

## 2020-09-05 DIAGNOSIS — M545 Low back pain, unspecified: Secondary | ICD-10-CM | POA: Diagnosis not present

## 2020-09-05 DIAGNOSIS — M25552 Pain in left hip: Secondary | ICD-10-CM | POA: Diagnosis not present

## 2020-09-10 DIAGNOSIS — M545 Low back pain, unspecified: Secondary | ICD-10-CM | POA: Diagnosis not present

## 2020-09-10 DIAGNOSIS — M25552 Pain in left hip: Secondary | ICD-10-CM | POA: Diagnosis not present

## 2020-09-12 DIAGNOSIS — M545 Low back pain, unspecified: Secondary | ICD-10-CM | POA: Diagnosis not present

## 2020-09-12 DIAGNOSIS — M25552 Pain in left hip: Secondary | ICD-10-CM | POA: Diagnosis not present

## 2020-09-17 DIAGNOSIS — M25552 Pain in left hip: Secondary | ICD-10-CM | POA: Diagnosis not present

## 2020-09-17 DIAGNOSIS — M545 Low back pain, unspecified: Secondary | ICD-10-CM | POA: Diagnosis not present

## 2020-09-29 ENCOUNTER — Telehealth (HOSPITAL_COMMUNITY): Payer: Self-pay | Admitting: Emergency Medicine

## 2020-09-29 ENCOUNTER — Other Ambulatory Visit: Payer: Self-pay

## 2020-09-29 ENCOUNTER — Encounter (HOSPITAL_COMMUNITY): Payer: Self-pay

## 2020-09-29 ENCOUNTER — Ambulatory Visit (HOSPITAL_COMMUNITY)
Admission: EM | Admit: 2020-09-29 | Discharge: 2020-09-29 | Disposition: A | Discharge status: Home / Self Care | Payer: Medicare Other | Attending: Family Medicine | Admitting: Family Medicine

## 2020-09-29 DIAGNOSIS — M5441 Lumbago with sciatica, right side: Secondary | ICD-10-CM

## 2020-09-29 DIAGNOSIS — M5442 Lumbago with sciatica, left side: Secondary | ICD-10-CM | POA: Diagnosis not present

## 2020-09-29 MED ORDER — PREDNISONE 20 MG PO TABS: 40.0000 mg | ORAL_TABLET | Freq: Every day | ORAL | 0 refills | Status: DC

## 2020-09-29 NOTE — Discharge Instructions (Signed)
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
## 2020-09-29 NOTE — ED Triage Notes (Signed)
Pt presents with lower back pain x 2 days. States the back pain radiates to the legs when lay down. Denies dysuria, fall. Tylenol arthritis and heat pads gives relief.

## 2020-10-01 DIAGNOSIS — F17200 Nicotine dependence, unspecified, uncomplicated: Secondary | ICD-10-CM | POA: Diagnosis not present

## 2020-10-01 DIAGNOSIS — Z79899 Other long term (current) drug therapy: Secondary | ICD-10-CM | POA: Diagnosis not present

## 2020-10-01 DIAGNOSIS — M545 Low back pain, unspecified: Secondary | ICD-10-CM | POA: Diagnosis not present

## 2020-10-08 DIAGNOSIS — M25552 Pain in left hip: Secondary | ICD-10-CM | POA: Diagnosis not present

## 2020-10-08 DIAGNOSIS — M545 Low back pain, unspecified: Secondary | ICD-10-CM | POA: Diagnosis not present

## 2020-10-10 DIAGNOSIS — M545 Low back pain, unspecified: Secondary | ICD-10-CM | POA: Diagnosis not present

## 2020-10-10 DIAGNOSIS — M25552 Pain in left hip: Secondary | ICD-10-CM | POA: Diagnosis not present

## 2020-10-18 DIAGNOSIS — M791 Myalgia, unspecified site: Secondary | ICD-10-CM | POA: Diagnosis not present

## 2020-10-22 DIAGNOSIS — M545 Low back pain, unspecified: Secondary | ICD-10-CM | POA: Diagnosis not present

## 2020-10-22 DIAGNOSIS — M25552 Pain in left hip: Secondary | ICD-10-CM | POA: Diagnosis not present

## 2020-10-24 DIAGNOSIS — M545 Low back pain, unspecified: Secondary | ICD-10-CM | POA: Diagnosis not present

## 2020-10-24 DIAGNOSIS — M25552 Pain in left hip: Secondary | ICD-10-CM | POA: Diagnosis not present

## 2020-10-29 DIAGNOSIS — M545 Low back pain, unspecified: Secondary | ICD-10-CM | POA: Diagnosis not present

## 2020-10-29 DIAGNOSIS — M25552 Pain in left hip: Secondary | ICD-10-CM | POA: Diagnosis not present

## 2020-10-30 DIAGNOSIS — M545 Low back pain, unspecified: Secondary | ICD-10-CM | POA: Diagnosis not present

## 2020-10-30 DIAGNOSIS — Z79899 Other long term (current) drug therapy: Secondary | ICD-10-CM | POA: Diagnosis not present

## 2020-10-30 DIAGNOSIS — F17200 Nicotine dependence, unspecified, uncomplicated: Secondary | ICD-10-CM | POA: Diagnosis not present

## 2020-11-05 DIAGNOSIS — M545 Low back pain, unspecified: Secondary | ICD-10-CM | POA: Diagnosis not present

## 2020-11-05 DIAGNOSIS — M25552 Pain in left hip: Secondary | ICD-10-CM | POA: Diagnosis not present

## 2020-11-12 DIAGNOSIS — M25552 Pain in left hip: Secondary | ICD-10-CM | POA: Diagnosis not present

## 2020-11-12 DIAGNOSIS — M545 Low back pain, unspecified: Secondary | ICD-10-CM | POA: Diagnosis not present

## 2020-11-19 DIAGNOSIS — M25552 Pain in left hip: Secondary | ICD-10-CM | POA: Diagnosis not present

## 2020-11-19 DIAGNOSIS — M545 Low back pain, unspecified: Secondary | ICD-10-CM | POA: Diagnosis not present

## 2020-11-26 DIAGNOSIS — M25552 Pain in left hip: Secondary | ICD-10-CM | POA: Diagnosis not present

## 2020-11-26 DIAGNOSIS — M545 Low back pain, unspecified: Secondary | ICD-10-CM | POA: Diagnosis not present

## 2020-11-27 DIAGNOSIS — R300 Dysuria: Secondary | ICD-10-CM | POA: Diagnosis not present

## 2020-11-27 DIAGNOSIS — Z79899 Other long term (current) drug therapy: Secondary | ICD-10-CM | POA: Diagnosis not present

## 2020-11-27 DIAGNOSIS — M545 Low back pain, unspecified: Secondary | ICD-10-CM | POA: Diagnosis not present

## 2020-11-27 DIAGNOSIS — R82998 Other abnormal findings in urine: Secondary | ICD-10-CM | POA: Diagnosis not present

## 2020-11-27 DIAGNOSIS — F17200 Nicotine dependence, unspecified, uncomplicated: Secondary | ICD-10-CM | POA: Diagnosis not present

## 2020-12-10 DIAGNOSIS — M25552 Pain in left hip: Secondary | ICD-10-CM | POA: Diagnosis not present

## 2020-12-10 DIAGNOSIS — M545 Low back pain, unspecified: Secondary | ICD-10-CM | POA: Diagnosis not present

## 2020-12-31 DIAGNOSIS — Z79899 Other long term (current) drug therapy: Secondary | ICD-10-CM | POA: Diagnosis not present

## 2020-12-31 DIAGNOSIS — M545 Low back pain, unspecified: Secondary | ICD-10-CM | POA: Diagnosis not present

## 2020-12-31 DIAGNOSIS — F17200 Nicotine dependence, unspecified, uncomplicated: Secondary | ICD-10-CM | POA: Diagnosis not present

## 2021-01-29 DIAGNOSIS — F17200 Nicotine dependence, unspecified, uncomplicated: Secondary | ICD-10-CM | POA: Diagnosis not present

## 2021-01-29 DIAGNOSIS — M545 Low back pain, unspecified: Secondary | ICD-10-CM | POA: Diagnosis not present

## 2021-01-29 DIAGNOSIS — Z79899 Other long term (current) drug therapy: Secondary | ICD-10-CM | POA: Diagnosis not present

## 2021-02-11 DIAGNOSIS — Z79899 Other long term (current) drug therapy: Secondary | ICD-10-CM | POA: Diagnosis not present

## 2021-02-11 DIAGNOSIS — M5136 Other intervertebral disc degeneration, lumbar region: Secondary | ICD-10-CM | POA: Diagnosis not present

## 2021-02-11 DIAGNOSIS — Z0000 Encounter for general adult medical examination without abnormal findings: Secondary | ICD-10-CM | POA: Diagnosis not present

## 2021-02-11 DIAGNOSIS — Z008 Encounter for other general examination: Secondary | ICD-10-CM | POA: Diagnosis not present

## 2021-02-11 DIAGNOSIS — M25761 Osteophyte, right knee: Secondary | ICD-10-CM | POA: Diagnosis not present

## 2021-02-26 DIAGNOSIS — M545 Low back pain, unspecified: Secondary | ICD-10-CM | POA: Diagnosis not present

## 2021-02-26 DIAGNOSIS — Z79899 Other long term (current) drug therapy: Secondary | ICD-10-CM | POA: Diagnosis not present

## 2021-02-26 DIAGNOSIS — F17200 Nicotine dependence, unspecified, uncomplicated: Secondary | ICD-10-CM | POA: Diagnosis not present

## 2021-05-29 ENCOUNTER — Ambulatory Visit
Admission: RE | Admit: 2021-05-29 | Discharge: 2021-05-29 | Disposition: A | Discharge status: Home / Self Care | Payer: Medicare Other | Source: Ambulatory Visit | Attending: Acute Care | Admitting: Acute Care

## 2021-05-29 DIAGNOSIS — Z87891 Personal history of nicotine dependence: Secondary | ICD-10-CM

## 2021-05-29 DIAGNOSIS — F17210 Nicotine dependence, cigarettes, uncomplicated: Secondary | ICD-10-CM

## 2021-06-05 IMAGING — DX LUMBAR SPINE - 2-3 VIEW
3 series · 3 of 3 positions shown · non-contrast
Comparison: None.

CLINICAL DATA: Chronic low back pain, worsening pain and last week.
Fall 3 weeks ago.

EXAM:
LUMBAR SPINE - 2-3 VIEW

[l-spine ap]
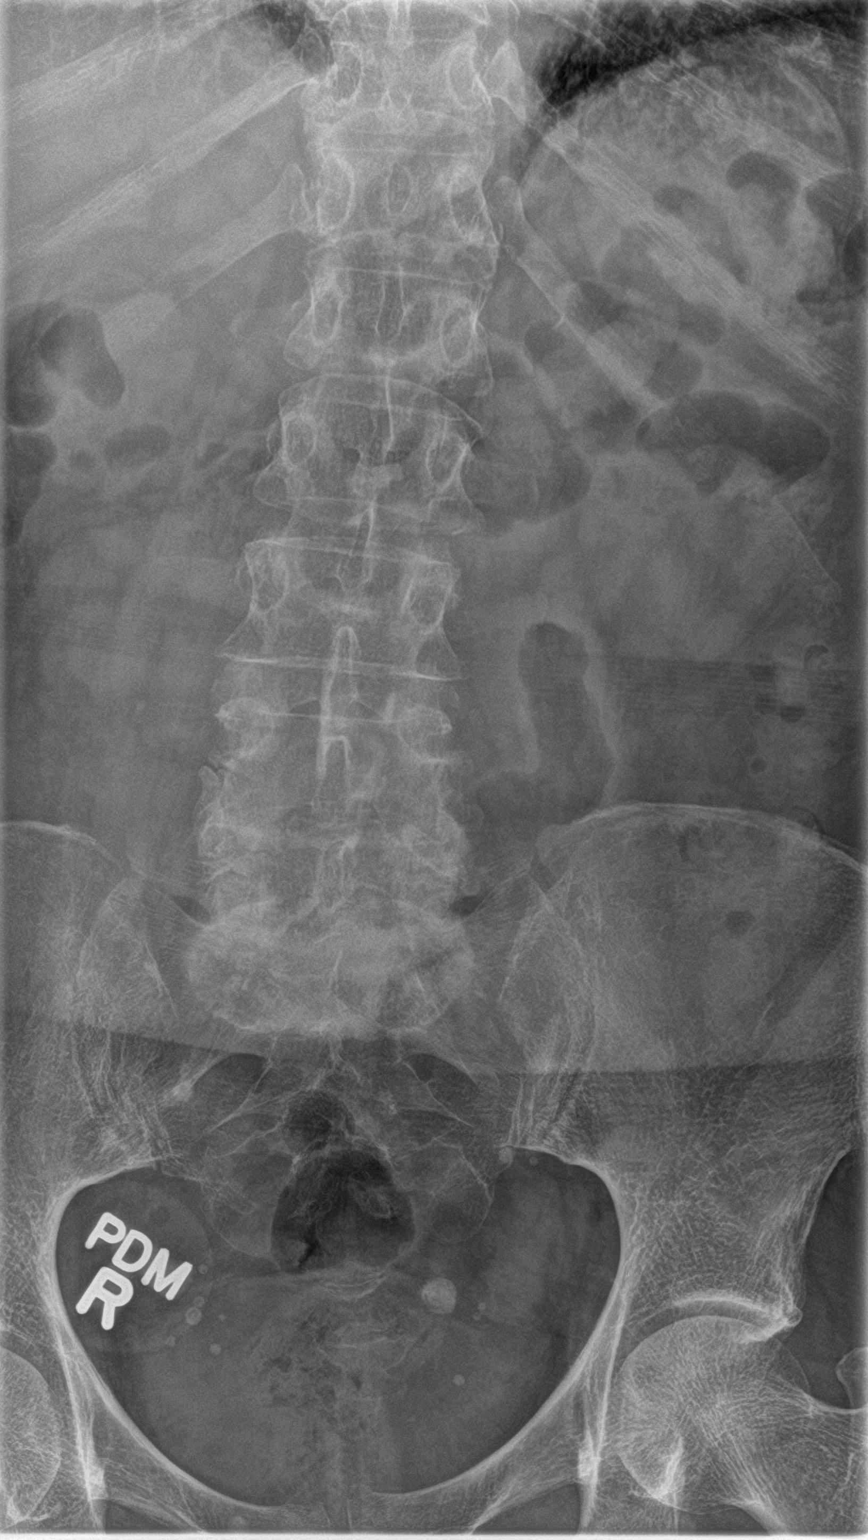

[l-spine lat]
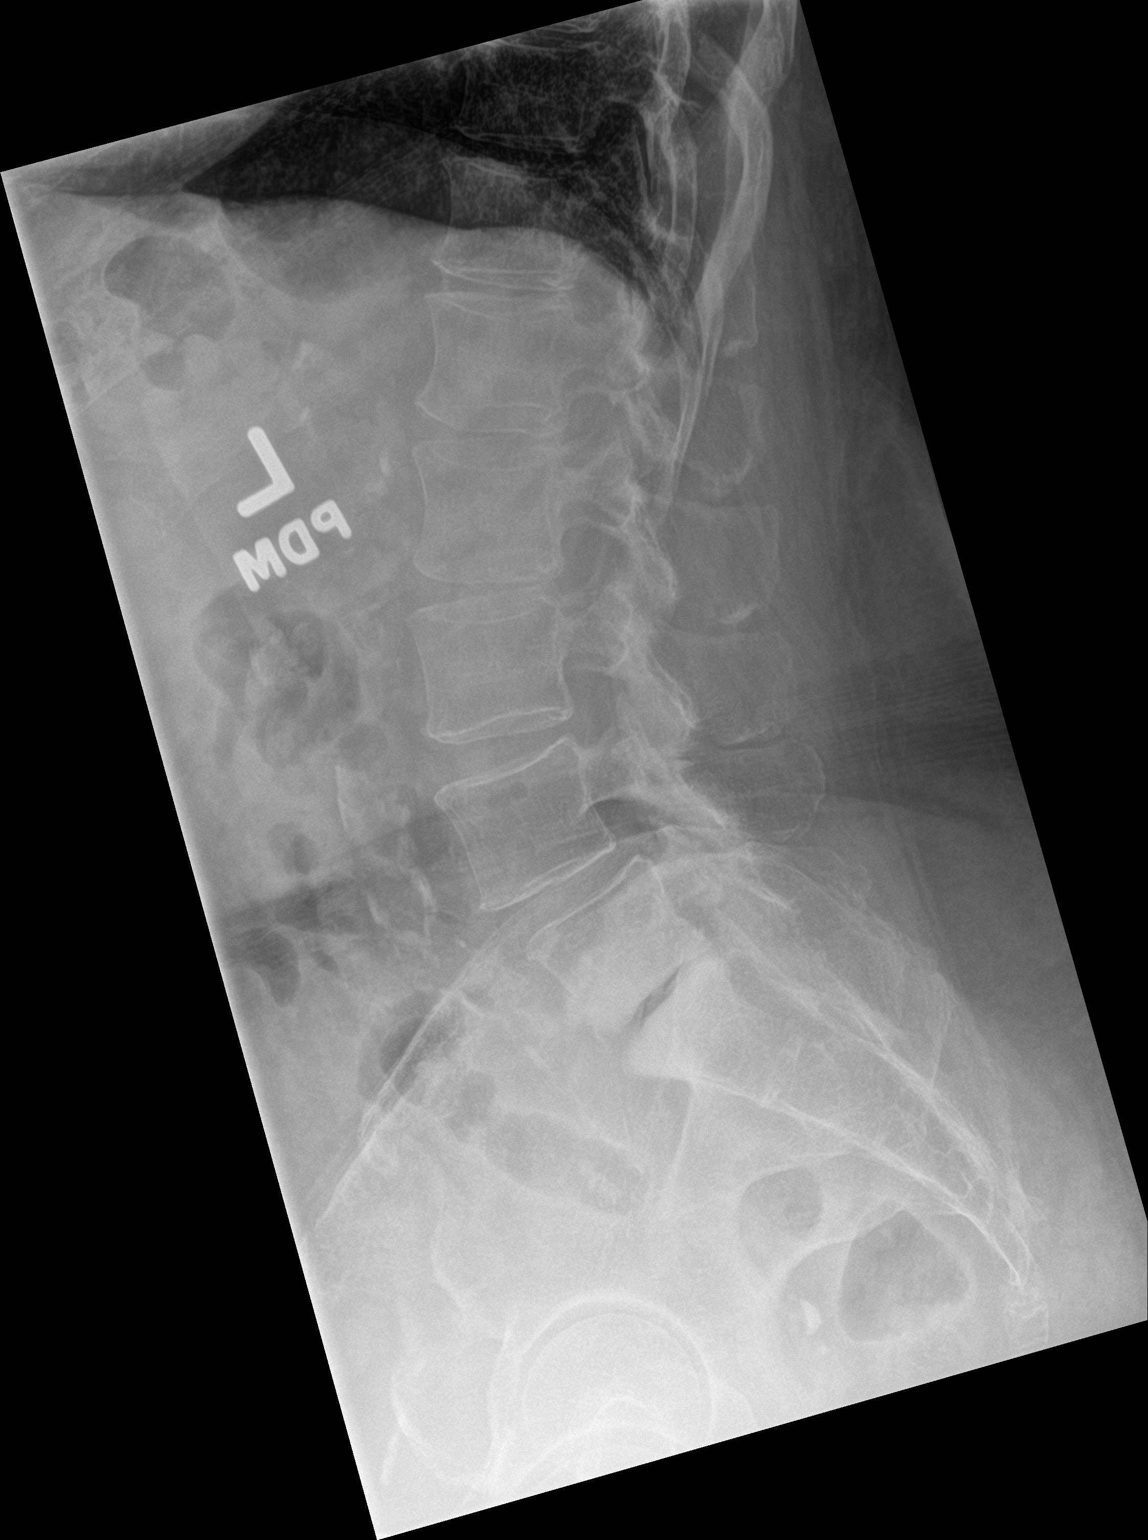

[l-spine spot]
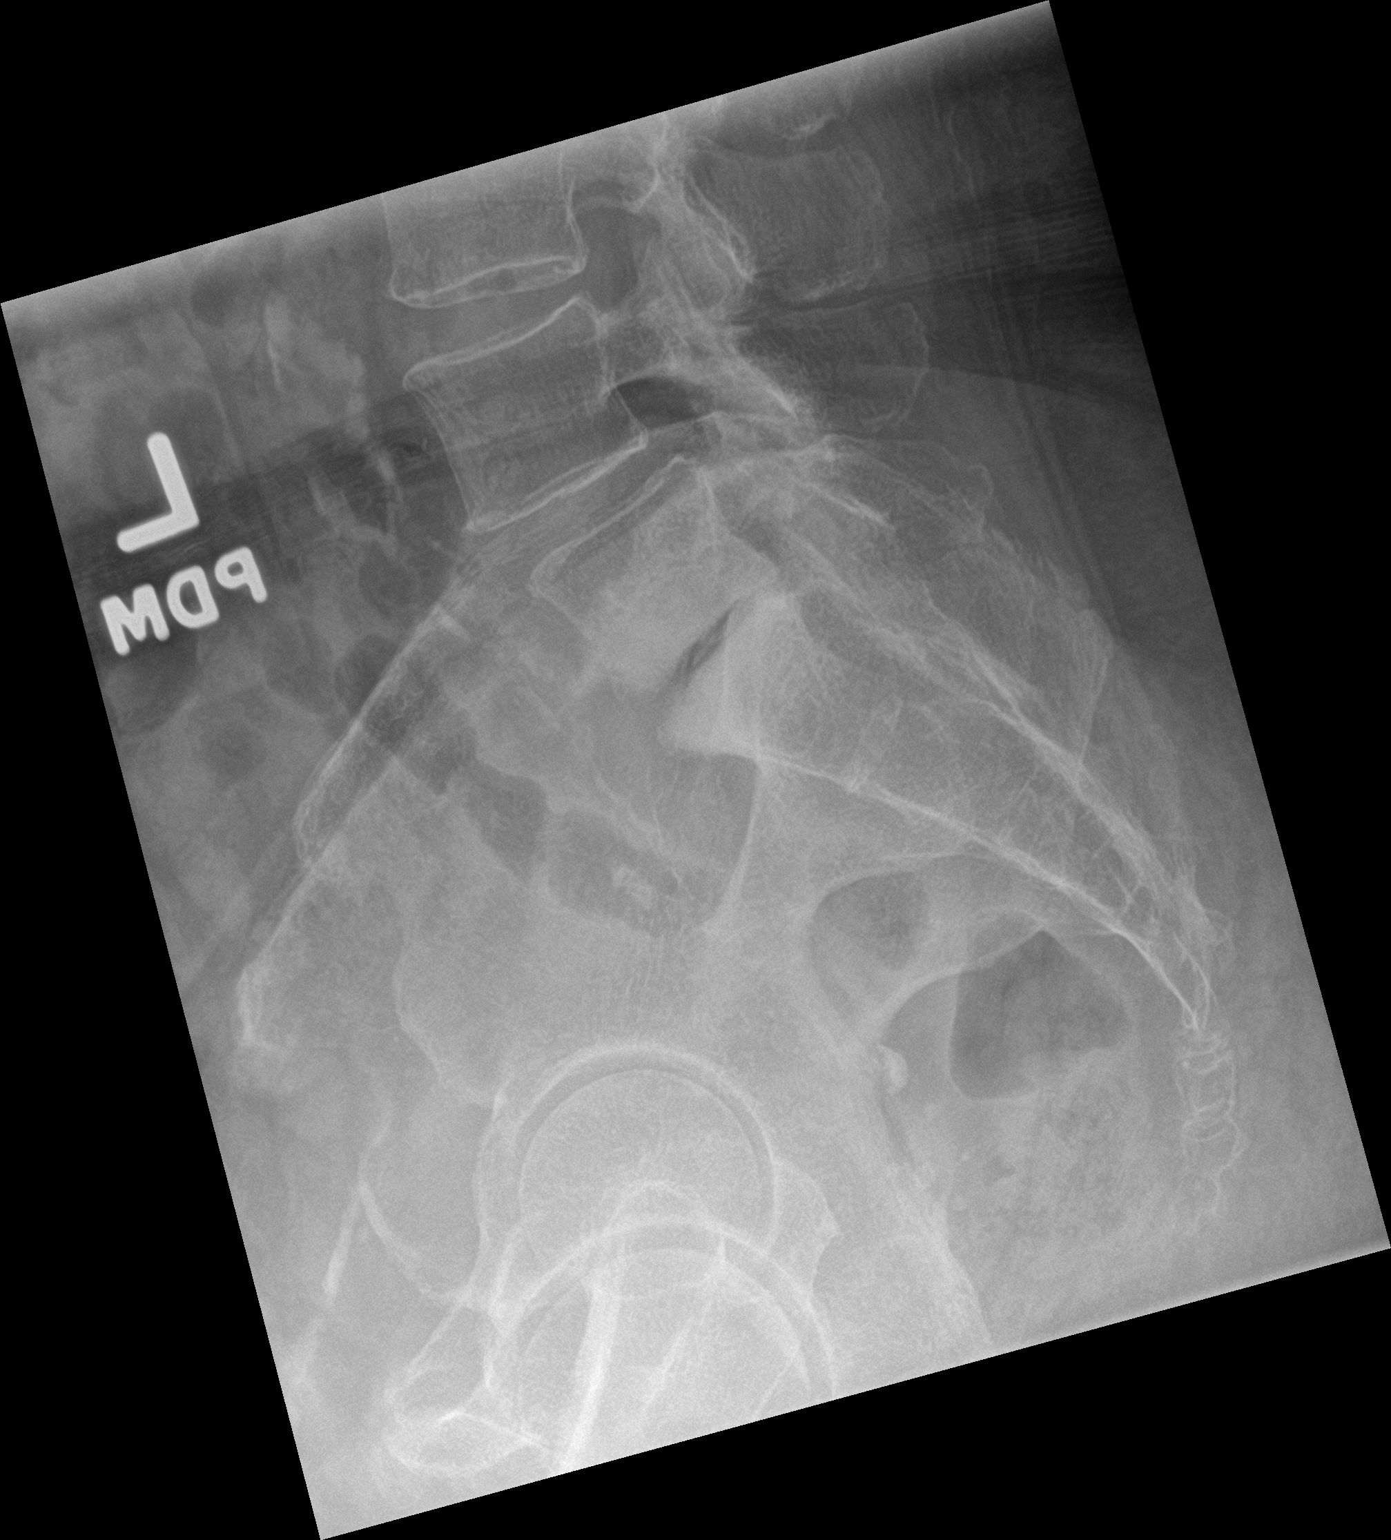

[3 of 3 positions shown; findings below may reference images not displayed]

FINDINGS: Mild scoliosis of the thoracolumbar spine. No evidence of acute
vertebral body subluxation. No fracture line or displaced fracture
fragment seen.

Degenerative spondylosis at the L5-S1 level, moderate in degree with
associated disc space narrowing and articular surface sclerosis.
Remainder of the disc spaces appear relatively well maintained.
Facet joints are normally aligned.

Aortic atherosclerosis. Visualized paravertebral soft tissues are
otherwise unremarkable.
IMPRESSION: 1. No acute findings.
2. Degenerative spondylosis at the L5-S1 level, moderate in degree.
3. Aortic atherosclerosis.

## 2021-06-13 ENCOUNTER — Other Ambulatory Visit: Payer: Self-pay | Admitting: *Deleted

## 2021-06-13 DIAGNOSIS — F17210 Nicotine dependence, cigarettes, uncomplicated: Secondary | ICD-10-CM

## 2021-06-13 DIAGNOSIS — Z87891 Personal history of nicotine dependence: Secondary | ICD-10-CM

## 2021-06-23 NOTE — Progress Notes (Signed)
Result Letter Sent by Doroteo Glassman RN 12 month follow up LDCT ordered Fax results report  sent to PCP  + CAD letter>> Not on statin, follow up with PCP

## 2021-07-01 ENCOUNTER — Other Ambulatory Visit: Payer: Self-pay | Admitting: Family

## 2021-07-01 ENCOUNTER — Other Ambulatory Visit: Payer: Self-pay | Admitting: Registered Nurse

## 2021-07-01 DIAGNOSIS — Z1231 Encounter for screening mammogram for malignant neoplasm of breast: Secondary | ICD-10-CM

## 2021-07-23 ENCOUNTER — Ambulatory Visit (INDEPENDENT_AMBULATORY_CARE_PROVIDER_SITE_OTHER): Payer: Medicare Other

## 2021-07-23 ENCOUNTER — Ambulatory Visit (INDEPENDENT_AMBULATORY_CARE_PROVIDER_SITE_OTHER): Payer: Medicare Other | Admitting: Podiatry

## 2021-07-23 ENCOUNTER — Other Ambulatory Visit: Payer: Self-pay

## 2021-07-23 VITALS — BP 132/76 | HR 64 | Temp 97.1°F

## 2021-07-23 DIAGNOSIS — M79675 Pain in left toe(s): Secondary | ICD-10-CM | POA: Diagnosis not present

## 2021-07-23 DIAGNOSIS — M2042 Other hammer toe(s) (acquired), left foot: Secondary | ICD-10-CM | POA: Diagnosis not present

## 2021-07-23 DIAGNOSIS — L84 Corns and callosities: Secondary | ICD-10-CM | POA: Diagnosis not present
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
## 2021-08-09 ENCOUNTER — Other Ambulatory Visit: Payer: Self-pay

## 2021-08-09 ENCOUNTER — Ambulatory Visit
Admission: RE | Admit: 2021-08-09 | Discharge: 2021-08-09 | Disposition: A | Discharge status: Home / Self Care | Payer: Medicare Other | Source: Ambulatory Visit | Attending: Family | Admitting: Family

## 2021-08-09 DIAGNOSIS — Z1231 Encounter for screening mammogram for malignant neoplasm of breast: Secondary | ICD-10-CM

## 2021-09-03 ENCOUNTER — Other Ambulatory Visit: Payer: Self-pay

## 2021-09-03 ENCOUNTER — Encounter: Payer: Self-pay | Admitting: Podiatry

## 2021-09-03 ENCOUNTER — Ambulatory Visit (INDEPENDENT_AMBULATORY_CARE_PROVIDER_SITE_OTHER): Payer: Medicare Other | Admitting: Podiatry

## 2021-09-03 DIAGNOSIS — M2042 Other hammer toe(s) (acquired), left foot: Secondary | ICD-10-CM

## 2021-09-05 NOTE — Progress Notes (Signed)
Subjective: 70 year old female presents the office today for concerns of recurrent callus to the left fifth toe which is very tender.  She not see any swelling or redness or any drainage coming from the area.  No recent injury or changes.  No other concerns today.  Objective: AAO x3, NAD DP/PT pulses palpable bilaterally, CRT less than 3 seconds Adductovarus present left fifth toe resulted in hyperkeratotic lesion just adjacent to the fifth digit toenail.  There is no underlying ulceration drainage or any signs of infection noted today.  No edema to the toe.  No other areas of discomfort identified today.  No pain with calf compression, swelling, warmth, erythema  Assessment: Skin lesion due to adductovarus left fifth toe  Plan: -All treatment options discussed with the patient including all alternatives, risks, complications.  -Sharp debrided hyperkeratotic lesions as a courtesy.  Discussed treatment option including conservative versus surgical options.  She was to continue conservative treatment for now.  Discussed offloading pads.  If needed consider derotational arthroplasty of the digit. -Patient encouraged to call the office with any questions, concerns, change in symptoms.   Trula Slade DPM

## 2021-09-12 ENCOUNTER — Encounter (HOSPITAL_COMMUNITY): Payer: Self-pay

## 2021-09-12 ENCOUNTER — Other Ambulatory Visit: Payer: Self-pay

## 2021-09-12 ENCOUNTER — Ambulatory Visit (HOSPITAL_COMMUNITY)
Admission: EM | Admit: 2021-09-12 | Discharge: 2021-09-12 | Disposition: A | Discharge status: Home / Self Care | Payer: Medicare Other | Attending: Emergency Medicine | Admitting: Emergency Medicine

## 2021-09-12 DIAGNOSIS — B029 Zoster without complications: Secondary | ICD-10-CM

## 2021-09-12 MED ORDER — DIPHENHYDRAMINE HCL 25 MG PO TABS: 12.5000 mg | ORAL_TABLET | Freq: Four times a day (QID) | ORAL | 0 refills | Status: DC | PRN

## 2021-09-12 MED ORDER — VALACYCLOVIR HCL 1 G PO TABS: 1000.0000 mg | ORAL_TABLET | Freq: Three times a day (TID) | ORAL | 0 refills | Status: AC

## 2021-09-12 MED ORDER — CETIRIZINE HCL 10 MG PO TABS: 10.0000 mg | ORAL_TABLET | Freq: Every day | ORAL | 0 refills | Status: DC

## 2021-09-12 NOTE — ED Triage Notes (Signed)
Pt c/o lt shoulder pain with some radiating across upper back and lt arm pain why laying down x2 days. Denies injury. States had center chest pain yesterday and this morning, states felt like heart burn. Denies any pain at this time.
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
## 2021-09-12 NOTE — Discharge Instructions (Addendum)
Begin taking valacyclovir 3 times a day for the next 7 days  Begin using cetirizine daily to help calm your itching, may also apply calamine lotion directly to the affected area  If that is not effective you may use Benadryl every 6 hours as needed, be mindful this medication may make you drowsy  If your pain persist even past your rash resolving you may follow-up in urgent care with your primary doctor for further evaluation  Once your symptoms have completely resolved you may follow-up with your primary care doctor for shingles vaccination to prevent further rashes

## 2021-10-14 ENCOUNTER — Ambulatory Visit (HOSPITAL_COMMUNITY)
Admission: EM | Admit: 2021-10-14 | Discharge: 2021-10-14 | Disposition: A | Discharge status: Home / Self Care | Payer: Medicare Other | Attending: Internal Medicine | Admitting: Internal Medicine

## 2021-10-14 ENCOUNTER — Other Ambulatory Visit: Payer: Self-pay

## 2021-10-14 ENCOUNTER — Encounter (HOSPITAL_COMMUNITY): Payer: Self-pay | Admitting: Emergency Medicine

## 2021-10-14 DIAGNOSIS — L853 Xerosis cutis: Secondary | ICD-10-CM | POA: Diagnosis not present

## 2021-10-14 MED ORDER — CLOTRIMAZOLE-BETAMETHASONE 1-0.05 % EX CREA: TOPICAL_CREAM | CUTANEOUS | 0 refills | Status: DC

## 2021-10-14 NOTE — ED Triage Notes (Signed)
2 days ago, she noticed painful rash on right hip. She was diagnosed with shingles to left breast 1 month ago. This rash feels similar.  Rash on right hip is itchy.

## 2021-10-14 NOTE — Discharge Instructions (Addendum)
Please moisturize your skin at least twice a day If itching sensation persist over the next couple of days, please pick up the prescription from the pharmacy. Return to urgent care if symptoms worsen.
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
## 2021-12-27 IMAGING — CT CT RENAL STONE PROTOCOL
1 of 2 series · 14 of 32 positions shown, 19 images · non-contrast
Comparison: CT abdomen and pelvis 09/15/2016.

CLINICAL DATA: Left flank pain in a patient with a history of
urinary tract stones.

EXAM:
CT ABDOMEN AND PELVIS WITHOUT CONTRAST
TECHNIQUE: Multidetector CT imaging of the abdomen and pelvis was performed
following the standard protocol without IV contrast.

[Series 2: renal standard/full · axial · 0.75mm/px · z∈[-356,+14]mm · 14 of 84 slices shown, 19 images]
[im 5/84  soft-tissue]
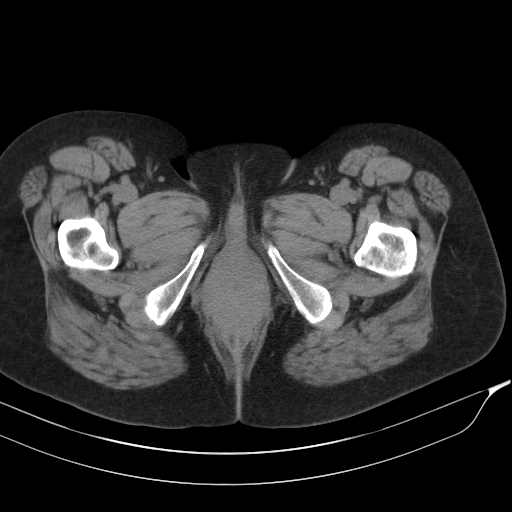
[im 5/84  bone]
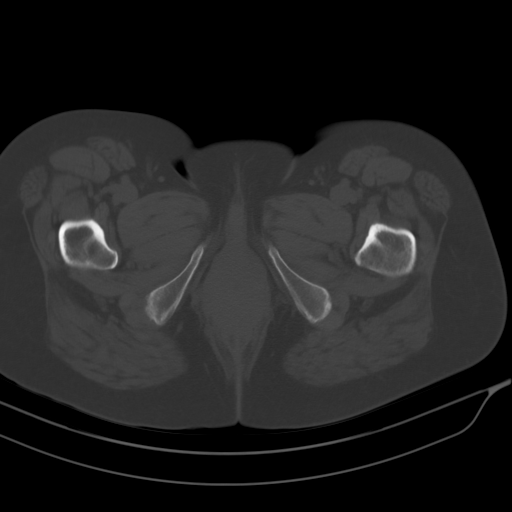
[im 14/84  soft-tissue]
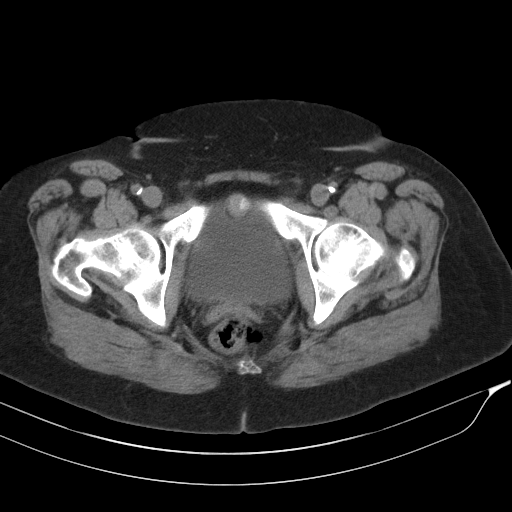
[im 18/84  soft-tissue]
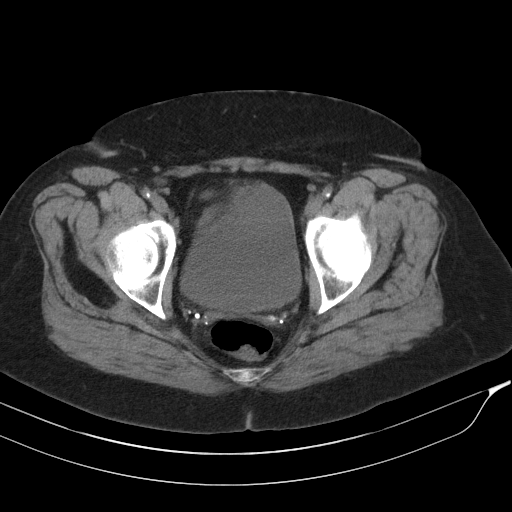
[im 22/84  soft-tissue]
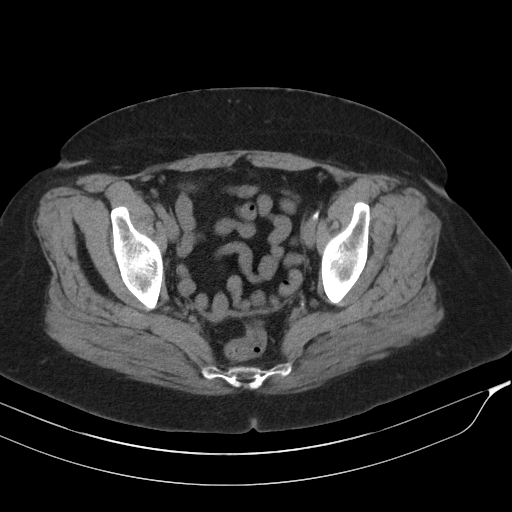
[im 31/84  soft-tissue]
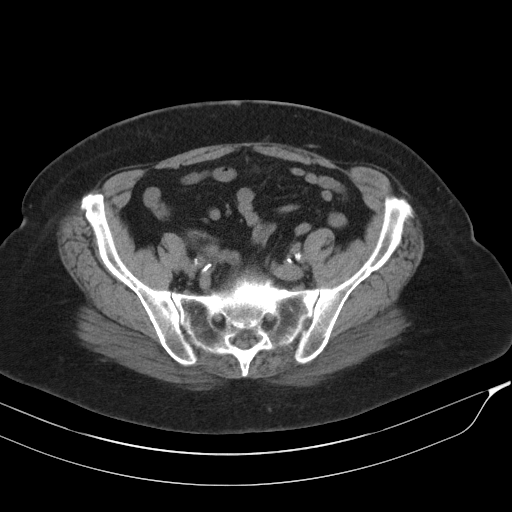
[im 35/84  soft-tissue]
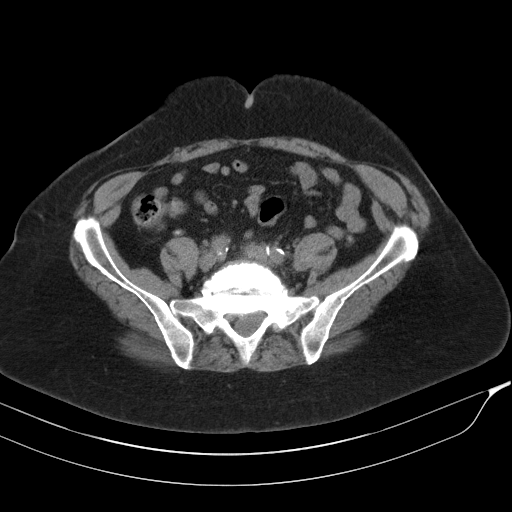
[im 44/84  soft-tissue]
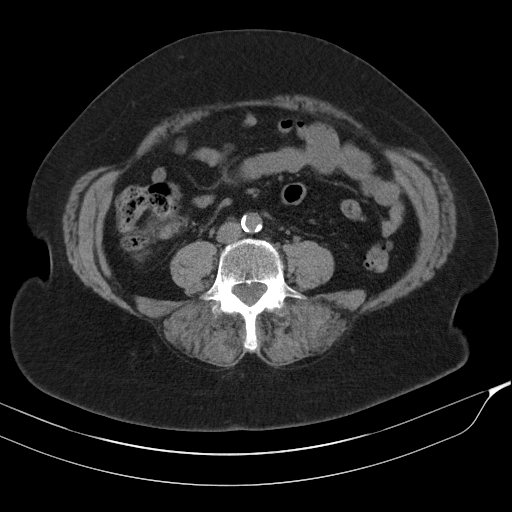
[im 49/84  soft-tissue]
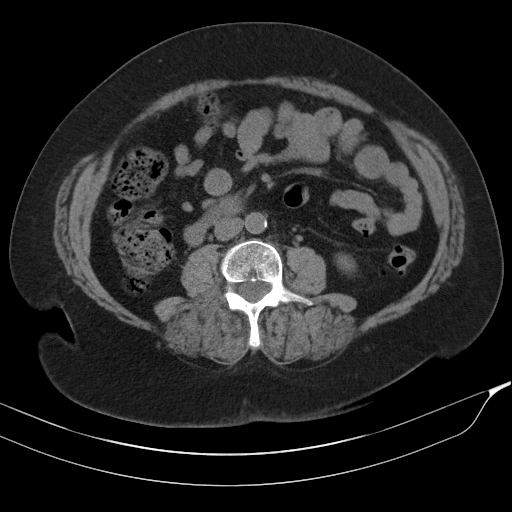
[im 53/84  soft-tissue]
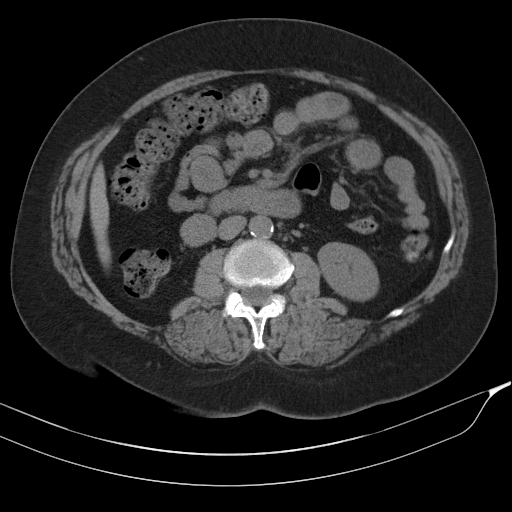
[im 53/84  bone]
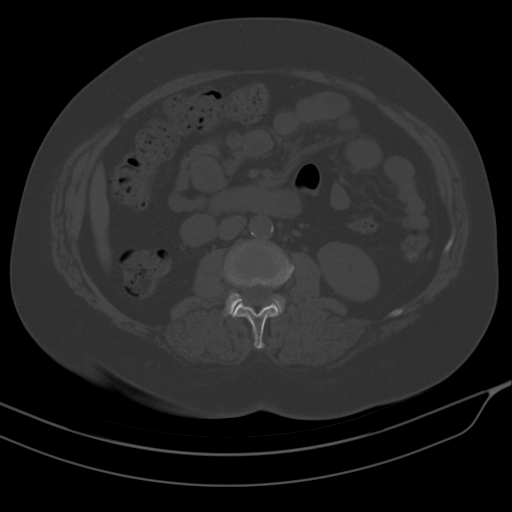
[im 62/84  soft-tissue]
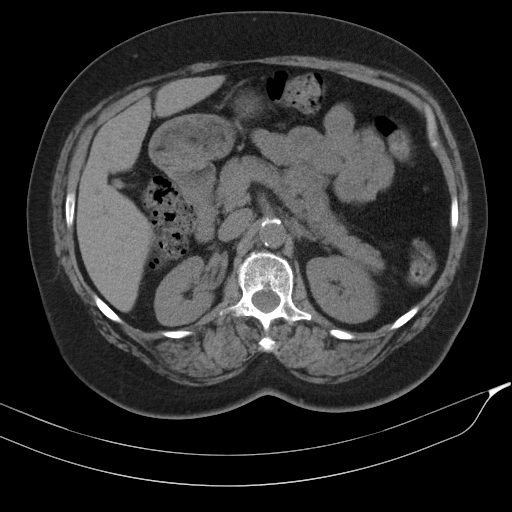
[im 66/84  soft-tissue]
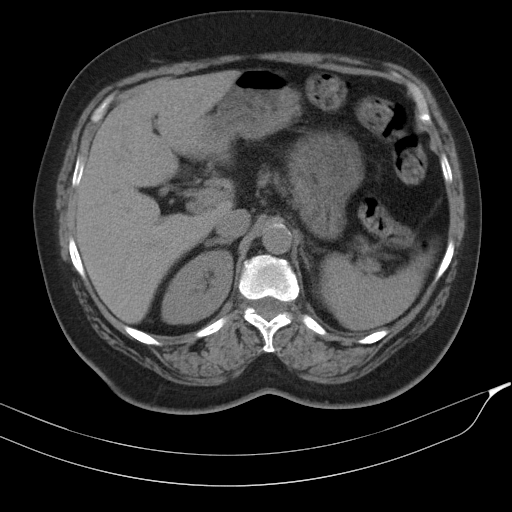
[im 66/84  lung]
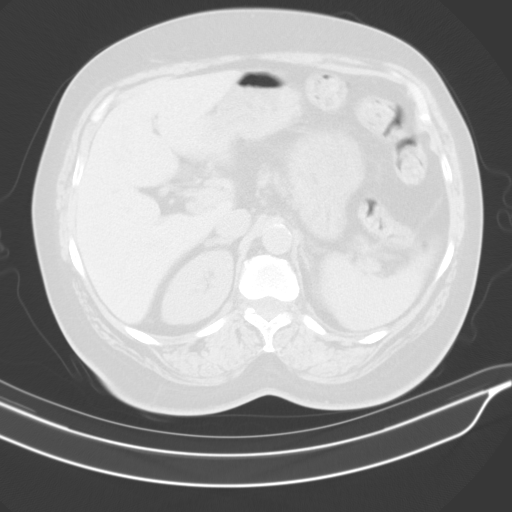
[im 70/84  soft-tissue]
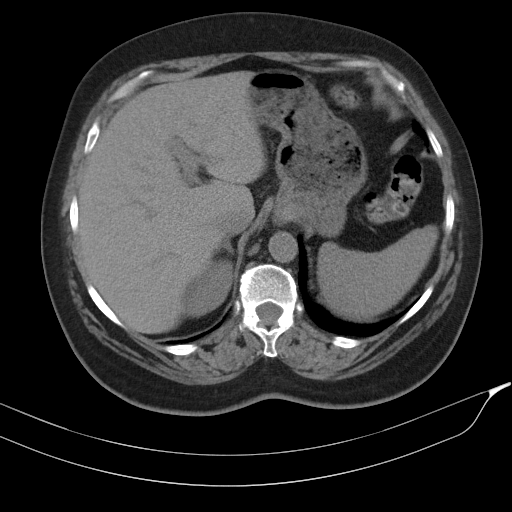
[im 70/84  lung]
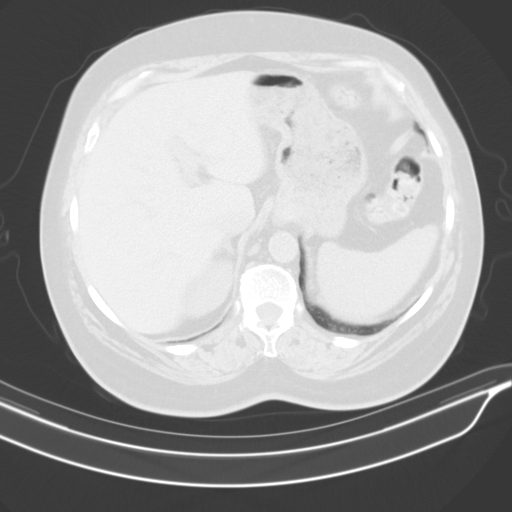
[im 75/84  lung]
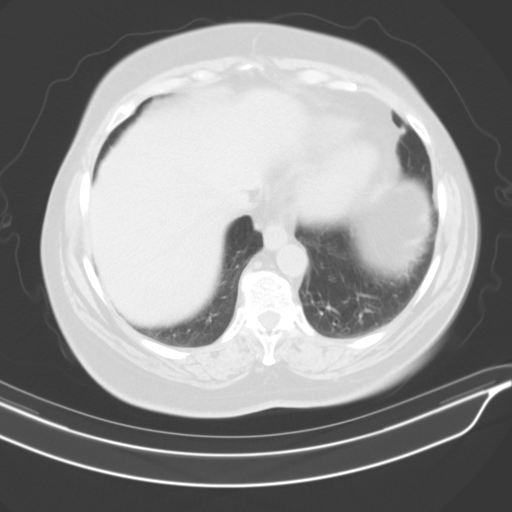
[im 79/84  soft-tissue]
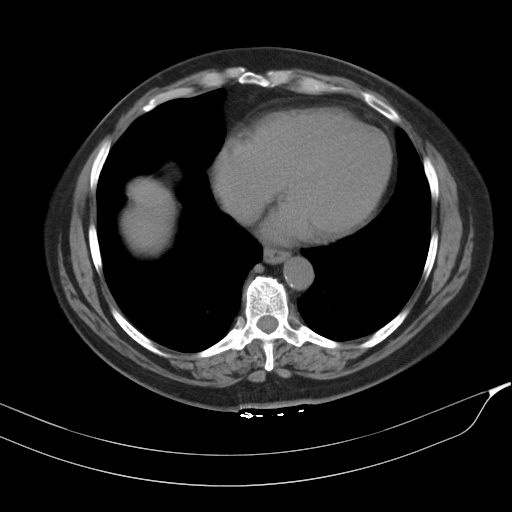
[im 79/84  lung]
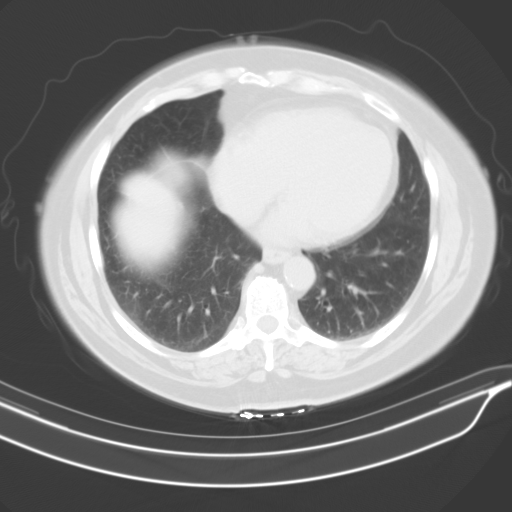

[14 of 32 positions shown; findings below may reference images not displayed]

FINDINGS: Lower chest: Lung bases clear. No pleural or pericardial effusion.

Hepatobiliary: No focal liver abnormality is seen. No gallstones,
gallbladder wall thickening, or biliary dilatation.

Pancreas: Unremarkable. No pancreatic ductal dilatation or
surrounding inflammatory changes.

Spleen: Normal in size without focal abnormality.

Adrenals/Urinary Tract: Adrenal glands are unremarkable. Kidneys are
normal, without renal calculi, focal lesion, or hydronephrosis.
Bladder is unremarkable.

Stomach/Bowel: Stomach is within normal limits. Appendix appears
normal. No evidence of bowel wall thickening, distention, or
inflammatory changes. Diverticulosis of the descending colon again
seen.

Vascular/Lymphatic: Aortic atherosclerosis. No enlarged abdominal or
pelvic lymph nodes.

Reproductive: Status post hysterectomy. No adnexal masses.

Other: Small fat containing umbilical hernia noted.

Musculoskeletal: No acute or focal abnormality. Degenerative disc
disease L5-S1 is seen.
IMPRESSION: 1. Negative for urinary tract stone. No acute abnormality or finding
to explain the patient's symptoms.
2. Diverticulosis without diverticulitis.
3. Atherosclerosis.
4. Small fat containing umbilical hernia.

## 2022-01-17 ENCOUNTER — Telehealth: Payer: Self-pay

## 2022-01-17 NOTE — Telephone Encounter (Signed)
NOTES SCANNED TO REFERRAL 
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
## 2022-02-12 ENCOUNTER — Encounter: Payer: Self-pay | Admitting: Cardiovascular Disease

## 2022-02-12 ENCOUNTER — Ambulatory Visit (INDEPENDENT_AMBULATORY_CARE_PROVIDER_SITE_OTHER): Payer: 59 | Admitting: Cardiovascular Disease

## 2022-02-12 VITALS — BP 130/68 | HR 61 | Ht 66.0 in | Wt 185.0 lb

## 2022-02-12 DIAGNOSIS — R55 Syncope and collapse: Secondary | ICD-10-CM | POA: Diagnosis not present

## 2022-02-12 DIAGNOSIS — R079 Chest pain, unspecified: Secondary | ICD-10-CM

## 2022-02-12 DIAGNOSIS — I10 Essential (primary) hypertension: Secondary | ICD-10-CM

## 2022-02-12 DIAGNOSIS — E785 Hyperlipidemia, unspecified: Secondary | ICD-10-CM | POA: Diagnosis not present

## 2022-02-12 NOTE — Patient Instructions (Addendum)
Medication Instructions:  ?Your physician recommends that you continue on your current medications as directed. Please refer to the Current Medication list given to you today. ? ?*If you need a refill on your cardiac medications before your next appointment, please call your pharmacy* ? ?Lab Work: ?If you have labs (blood work) drawn today and your tests are completely normal, you will receive your results only by: ?MyChart Message (if you have MyChart) OR ?A paper copy in the mail ?If you have any lab test that is abnormal or we need to change your treatment, we will call you to review the results. ? ?Testing/Procedures: ?Your physician has requested that you have an echocardiogram. Echocardiography is a painless test that uses sound waves to create images of your heart. It provides your doctor with information about the size and shape of your heart and how well your heart?s chambers and valves are working. This procedure takes approximately one hour. There are no restrictions for this procedure. ? ?CT scanning for cancer screening, (CAT scanning), is a noninvasive, special x-ray that produces cross-sectional images of the body using x-rays and a computer. CT scans help physicians diagnose and treat medical conditions. For some CT exams, a contrast material is used to enhance visibility in the area of the body being studied. CT scans provide greater clarity and reveal more details than regular x-ray exams. ? ?Your physician has requested that you have cardiac PET CT. Cardiac computed tomography (CT) is a painless test that uses an x-ray machine to take clear, detailed pictures of your heart. For further information please visit [email or web address]. Please follow instruction sheet as given. ? ?Follow-Up: ?At [facility], you and your health needs are our priority.  As part of our continuing mission to provide you with exceptional heart care, we have created designated Provider Care Teams.  These Care Teams  include your primary Cardiologist (physician) and Advanced Practice Providers (APPs -  Physician Assistants and Nurse Practitioners) who all work together to provide you with the care you need, when you need it. ? ?We recommend signing up for the patient portal called "MyChart".  Sign up information is provided on this After Visit Summary.  MyChart is used to connect with patients for Virtual Visits (Telemedicine).  Patients are able to view lab/test results, encounter notes, upcoming appointments, etc.  Non-urgent messages can be sent to your provider as well.   ?To learn more about what you can do with MyChart, go to [email or web address].   ? ?Your next appointment:   ?As needed ? ?The format for your next appointment:   ?In Person ? ?Provider:   ?Jenkins Rouge, MD { ? ?How to Prepare for Your Cardiac PET/CT Stress Test: ? ?1. Please take medications with small sip of water. ? ?2. Nothing to eat or drink, except water, 3 hours prior to arrival time.   ?NO caffeine/decaffeinated products, or chocolate 12 hours prior to arrival. ? ?3. NO perfume, cologne or lotion ? ?4. Total time is 1 to 2 hours; you may want to bring reading material for the waiting time. ? ?5. Please report to Admitting at the [facility] Entrance 60 minutes early for your test. ? [address] [place], [place] [address] ? ?IF YOU THINK YOU MAY BE PREGNANT, OR ARE NURSING PLEASE INFORM THE TECHNOLOGIST. ? ?In preparation for your appointment, medication and supplies will be purchased.  Appointment availability is limited, so if you need to cancel or reschedule, please call the Radiology Department  at [phone]  24 hours in advance to avoid a cancellation fee of $100.00 ? ?What to Expect After you Arrive: ? ?Once you arrive and check in for your appointment, you will be taken to a preparation room within the Radiology Department.  A technologist or Nurse will obtain your medical history, verify that you are correctly  prepped for the exam, and explain the procedure.  Afterwards,  an IV will be started in your arm and electrodes will be placed on your skin for EKG monitoring during the stress portion of the exam. Then you will be escorted to the PET/CT scanner.  There, staff will get you positioned on the scanner and obtain a blood pressure and EKG.  During the exam, you will continue to be connected to the EKG and blood pressure machines.  A small, safe amount of a radioactive tracer will be injected in your IV to obtain a series of pictures of your heart along with an injection of a stress agent.   ? ?After your Exam: ? ?It is recommended that you eat a meal and drink a caffeinated beverage to counter act any effects of the stress agent.  Drink plenty of fluids for the remainder of the day and urinate frequently for the first couple of hours after the exam.  Your doctor will inform you of your test results within 7-10 business days. ? ?For questions about your test or how to prepare for your test, please call: ?Marchia Bond, Cardiac Imaging Nurse Navigator  ?Gordy Clement, Cardiac Imaging Nurse Navigator ?Office: [phone]  ? ?Important Information About Sugar ? ? ? ? ?  ?

## 2022-02-25 ENCOUNTER — Ambulatory Visit (HOSPITAL_COMMUNITY)
Admission: RE | Admit: 2022-02-25 | Discharge: 2022-02-25 | Disposition: A | Discharge status: Home / Self Care | Payer: 59 | Source: Ambulatory Visit | Attending: Cardiovascular Disease | Admitting: Cardiovascular Disease

## 2022-02-25 DIAGNOSIS — E785 Hyperlipidemia, unspecified: Secondary | ICD-10-CM | POA: Diagnosis present

## 2022-02-25 DIAGNOSIS — Z122 Encounter for screening for malignant neoplasm of respiratory organs: Secondary | ICD-10-CM | POA: Diagnosis not present

## 2022-02-25 DIAGNOSIS — F17210 Nicotine dependence, cigarettes, uncomplicated: Secondary | ICD-10-CM | POA: Insufficient documentation

## 2022-02-25 DIAGNOSIS — J432 Centrilobular emphysema: Secondary | ICD-10-CM | POA: Insufficient documentation

## 2022-02-25 DIAGNOSIS — I10 Essential (primary) hypertension: Secondary | ICD-10-CM | POA: Insufficient documentation

## 2022-02-25 DIAGNOSIS — I700 Atherosclerosis of aorta: Secondary | ICD-10-CM | POA: Diagnosis not present

## 2022-02-28 ENCOUNTER — Telehealth: Payer: Self-pay | Admitting: Cardiovascular Disease

## 2022-02-28 NOTE — Telephone Encounter (Signed)
Patient states she is returning a call to [facility], Therapist, sports.

## 2022-02-28 NOTE — Telephone Encounter (Signed)
Called patient back. She was wondering if I had called her again after talking to her about her results. Informed patient that I did not call her and it was probably an old message. Patient agreed and thanked me for the call back.

## 2022-03-05 ENCOUNTER — Ambulatory Visit (HOSPITAL_COMMUNITY): Payer: 59 | Attending: Cardiovascular Disease

## 2022-03-05 DIAGNOSIS — I10 Essential (primary) hypertension: Secondary | ICD-10-CM | POA: Insufficient documentation

## 2022-03-05 DIAGNOSIS — E785 Hyperlipidemia, unspecified: Secondary | ICD-10-CM | POA: Diagnosis not present

## 2022-03-05 DIAGNOSIS — R55 Syncope and collapse: Secondary | ICD-10-CM

## 2022-03-05 LAB — ECHOCARDIOGRAM COMPLETE
Area-P 1/2: 2.73 cm2
S' Lateral: 2.8 cm

## 2022-04-02 ENCOUNTER — Telehealth: Payer: Self-pay | Admitting: Acute Care

## 2022-04-02 DIAGNOSIS — Z87891 Personal history of nicotine dependence: Secondary | ICD-10-CM

## 2022-04-02 DIAGNOSIS — F17210 Nicotine dependence, cigarettes, uncomplicated: Secondary | ICD-10-CM

## 2022-04-02 DIAGNOSIS — Z122 Encounter for screening for malignant neoplasm of respiratory organs: Secondary | ICD-10-CM

## 2022-04-03 NOTE — Telephone Encounter (Signed)
Yes it looks like lung screening CT was done under Dr Johnsie Cancel. I will place order for repeat lung screening CT to be done in 1 yr.

## 2022-05-29 ENCOUNTER — Other Ambulatory Visit: Payer: 59

## 2022-06-09 ENCOUNTER — Telehealth (HOSPITAL_COMMUNITY): Payer: Self-pay | Admitting: *Deleted

## 2022-06-09 NOTE — Telephone Encounter (Signed)
Reaching out to patient to offer assistance regarding upcoming cardiac imaging study; pt verbalizes understanding of appt date/time, parking situation and where to check in, pre-test NPO status  and verified current allergies; name and call back number provided for further questions should they arise  Niambi Smoak RN Navigator Cardiac Imaging [facility] [phone] office [phone] cell  Patient aware to avoid caffeine 12 hours prior to her cardiac PET scan. 

## 2022-06-10 ENCOUNTER — Encounter (HOSPITAL_COMMUNITY)
Admission: RE | Admit: 2022-06-10 | Discharge: 2022-06-10 | Disposition: A | Discharge status: Home / Self Care | Payer: 59 | Source: Ambulatory Visit | Attending: Cardiovascular Disease | Admitting: Cardiovascular Disease

## 2022-06-10 DIAGNOSIS — R079 Chest pain, unspecified: Secondary | ICD-10-CM

## 2022-06-10 LAB — NM PET CT CARDIAC PERFUSION MULTI W/ABSOLUTE BLOODFLOW
MBFR: 1.82
Nuc Rest EF: 55 %
Nuc Stress EF: 70 %
Rest MBF: 0.96 ml/g/min
ST Depression (mm): 0 mm
Stress MBF: 1.75 ml/g/min
TID: 1.1

## 2022-06-10 MED ORDER — RUBIDIUM RB82 GENERATOR (RUBYFILL)
21.8000 | PACK | Freq: Once | INTRAVENOUS | Status: AC
  Administered 2022-06-10: 22 via INTRAVENOUS

## 2022-06-10 MED ORDER — REGADENOSON 0.4 MG/5ML IV SOLN: 0.4000 mg | Freq: Once | INTRAVENOUS | Status: AC

## 2022-06-10 MED ORDER — RUBIDIUM RB82 GENERATOR (RUBYFILL)
22.0000 | PACK | Freq: Once | INTRAVENOUS | Status: AC
  Administered 2022-06-10: 22 via INTRAVENOUS

## 2022-06-10 MED ORDER — REGADENOSON 0.4 MG/5ML IV SOLN
INTRAVENOUS | Status: AC
  Administered 2022-06-10: 0.4 mg via INTRAVENOUS
  Filled 2022-06-10: qty 5

## 2022-06-10 NOTE — Progress Notes (Signed)
Patient presents for a cardiac PET stress test and tolerated procedure without incident. Patient maintained acceptable vital signs throughout the test and was offered caffeine after test.  Patient ambulated out of department with a steady gait.  

## 2022-08-12 ENCOUNTER — Other Ambulatory Visit: Payer: Self-pay | Admitting: Registered Nurse

## 2022-08-12 DIAGNOSIS — Z1231 Encounter for screening mammogram for malignant neoplasm of breast: Secondary | ICD-10-CM

## 2022-10-09 ENCOUNTER — Ambulatory Visit: Payer: 59

## 2022-10-09 ENCOUNTER — Ambulatory Visit
Admission: RE | Admit: 2022-10-09 | Discharge: 2022-10-09 | Disposition: A | Discharge status: Home / Self Care | Payer: 59 | Source: Ambulatory Visit | Attending: Registered Nurse | Admitting: Registered Nurse

## 2022-10-09 DIAGNOSIS — Z1231 Encounter for screening mammogram for malignant neoplasm of breast: Secondary | ICD-10-CM

## 2022-10-14 ENCOUNTER — Other Ambulatory Visit: Payer: Self-pay | Admitting: Registered Nurse

## 2022-10-14 DIAGNOSIS — R928 Other abnormal and inconclusive findings on diagnostic imaging of breast: Secondary | ICD-10-CM

## 2022-10-24 ENCOUNTER — Ambulatory Visit
Admission: RE | Admit: 2022-10-24 | Discharge: 2022-10-24 | Disposition: A | Discharge status: Home / Self Care | Payer: 59 | Source: Ambulatory Visit | Attending: Registered Nurse | Admitting: Registered Nurse

## 2022-10-24 ENCOUNTER — Other Ambulatory Visit: Payer: Self-pay | Admitting: Registered Nurse

## 2022-10-24 DIAGNOSIS — R928 Other abnormal and inconclusive findings on diagnostic imaging of breast: Secondary | ICD-10-CM

## 2022-10-24 DIAGNOSIS — N6310 Unspecified lump in the right breast, unspecified quadrant: Secondary | ICD-10-CM

## 2022-11-29 ENCOUNTER — Ambulatory Visit
Admission: EM | Admit: 2022-11-29 | Discharge: 2022-11-29 | Disposition: A | Discharge status: Home / Self Care | Payer: 59 | Attending: Emergency Medicine | Admitting: Emergency Medicine

## 2022-11-29 DIAGNOSIS — J069 Acute upper respiratory infection, unspecified: Secondary | ICD-10-CM | POA: Diagnosis not present

## 2022-11-29 MED ORDER — AZITHROMYCIN 250 MG PO TABS: 250.0000 mg | ORAL_TABLET | Freq: Every day | ORAL | 0 refills | Status: DC

## 2022-11-29 MED ORDER — BACLOFEN 5 MG PO TABS: 5.0000 mg | ORAL_TABLET | Freq: Two times a day (BID) | ORAL | 0 refills | Status: DC | PRN

## 2022-11-29 MED ORDER — PROMETHAZINE-DM 6.25-15 MG/5ML PO SYRP
2.5000 mL | ORAL_SOLUTION | Freq: Every evening | ORAL | 0 refills | Status: DC | PRN
Start: 2022-11-29 — End: 2023-11-05

## 2022-11-29 MED ORDER — BENZONATATE 100 MG PO CAPS: 100.0000 mg | ORAL_CAPSULE | Freq: Three times a day (TID) | ORAL | 0 refills | Status: DC

## 2022-11-29 NOTE — ED Triage Notes (Signed)
Pt reports she is having congestion and body aches x 5 days.

## 2022-11-29 NOTE — Discharge Instructions (Signed)
Your symptoms today are most likely being caused by a virus and should steadily improve in time it can take up to 7 to 10 days before you truly start to see a turnaround however things will get better  May take azithromycin prophylactically to provide coverage for bacteria  For your body aches may use baclofen twice daily as needed for additional comfort, be mindful this may make you feel drowsy, you may take this in addition to Tylenol and ibuprofen as needed  You may use Tessalon Perles every 8 hours to help calm your coughing  You may use cough syrup at bedtime, be mindful this also may make you drowsy    You can take Tylenol and/or Ibuprofen as needed for fever reduction and pain relief.   For cough: honey 1/2 to 1 teaspoon (you can dilute the honey in water or another fluid).  You can also use guaifenesin and dextromethorphan for cough. You can use a humidifier for chest congestion and cough.  If you don't have a humidifier, you can sit in the bathroom with the hot shower running.      For sore throat: try warm salt water gargles, cepacol lozenges, throat spray, warm tea or water with lemon/honey, popsicles or ice, or OTC cold relief medicine for throat discomfort.   For congestion: take a daily anti-histamine like Zyrtec, Claritin, and a oral decongestant, such as pseudoephedrine.  You can also use Flonase 1-2 sprays in each nostril daily.   It is important to stay hydrated: drink plenty of fluids (water, gatorade/powerade/pedialyte, juices, or teas) to keep your throat moisturized and help further relieve irritation/discomfort.
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
## 2023-02-26 ENCOUNTER — Ambulatory Visit (HOSPITAL_COMMUNITY)
Admission: RE | Admit: 2023-02-26 | Discharge: 2023-02-26 | Disposition: A | Discharge status: Home / Self Care | Payer: 59 | Source: Ambulatory Visit | Attending: Acute Care | Admitting: Acute Care

## 2023-02-26 DIAGNOSIS — F17210 Nicotine dependence, cigarettes, uncomplicated: Secondary | ICD-10-CM | POA: Insufficient documentation

## 2023-02-26 DIAGNOSIS — J432 Centrilobular emphysema: Secondary | ICD-10-CM | POA: Insufficient documentation

## 2023-02-26 DIAGNOSIS — I700 Atherosclerosis of aorta: Secondary | ICD-10-CM | POA: Insufficient documentation

## 2023-02-26 DIAGNOSIS — Z122 Encounter for screening for malignant neoplasm of respiratory organs: Secondary | ICD-10-CM | POA: Insufficient documentation

## 2023-02-26 DIAGNOSIS — Z87891 Personal history of nicotine dependence: Secondary | ICD-10-CM | POA: Diagnosis present

## 2023-03-02 ENCOUNTER — Other Ambulatory Visit: Payer: 59

## 2023-03-03 ENCOUNTER — Other Ambulatory Visit: Payer: Self-pay | Admitting: Acute Care

## 2023-03-03 DIAGNOSIS — Z87891 Personal history of nicotine dependence: Secondary | ICD-10-CM

## 2023-03-03 DIAGNOSIS — Z122 Encounter for screening for malignant neoplasm of respiratory organs: Secondary | ICD-10-CM

## 2023-03-03 DIAGNOSIS — F17210 Nicotine dependence, cigarettes, uncomplicated: Secondary | ICD-10-CM

## 2023-04-27 ENCOUNTER — Other Ambulatory Visit: Payer: Self-pay | Admitting: Registered Nurse

## 2023-04-27 ENCOUNTER — Ambulatory Visit
Admission: RE | Admit: 2023-04-27 | Discharge: 2023-04-27 | Disposition: A | Discharge status: Home / Self Care | Payer: 59 | Source: Ambulatory Visit | Attending: Registered Nurse | Admitting: Registered Nurse

## 2023-04-27 DIAGNOSIS — N6310 Unspecified lump in the right breast, unspecified quadrant: Secondary | ICD-10-CM

## 2023-09-24 ENCOUNTER — Ambulatory Visit: Payer: 59 | Attending: Cardiology | Admitting: Cardiology

## 2023-10-28 ENCOUNTER — Ambulatory Visit
Admission: RE | Admit: 2023-10-28 | Discharge: 2023-10-28 | Disposition: A | Discharge status: Home / Self Care | Payer: 59 | Source: Ambulatory Visit | Attending: Registered Nurse | Admitting: Registered Nurse

## 2023-10-28 ENCOUNTER — Ambulatory Visit
Admission: RE | Admit: 2023-10-28 | Discharge: 2023-10-28 | Disposition: A | Discharge status: Home / Self Care | Payer: 59 | Source: Ambulatory Visit | Attending: Registered Nurse

## 2023-10-28 DIAGNOSIS — N6310 Unspecified lump in the right breast, unspecified quadrant: Secondary | ICD-10-CM

## 2023-11-05 ENCOUNTER — Ambulatory Visit: Payer: 59 | Attending: Cardiology | Admitting: Cardiology

## 2023-11-05 ENCOUNTER — Encounter: Payer: Self-pay | Admitting: Cardiology

## 2023-11-05 VITALS — BP 108/70 | HR 102 | Resp 16 | Ht 66.0 in | Wt 179.0 lb

## 2023-11-05 DIAGNOSIS — I10 Essential (primary) hypertension: Secondary | ICD-10-CM

## 2023-11-05 DIAGNOSIS — R030 Elevated blood-pressure reading, without diagnosis of hypertension: Secondary | ICD-10-CM

## 2023-11-05 DIAGNOSIS — E785 Hyperlipidemia, unspecified: Secondary | ICD-10-CM | POA: Diagnosis not present

## 2023-11-05 NOTE — Patient Instructions (Signed)
 Medication Instructions:  Your physician recommends that you continue on your current medications as directed. Please refer to the Current Medication list given to you today. *If you need a refill on your cardiac medications before your next appointment, please call your pharmacy*   Lab Work: CMP and Direct lipid TODAY - this can be completed at [facility] on the first floor of our building If you have labs (blood work) drawn today and your tests are completely normal, you will receive your results only by: MyChart Message (if you have MyChart) OR A paper copy in the mail If you have any lab test that is abnormal or we need to change your treatment, we will call you to review the results.   Follow-Up: At [facility], you and your health needs are our priority.  As part of our continuing mission to provide you with exceptional heart care, we have created designated Provider Care Teams.  These Care Teams include your primary Cardiologist (physician) and Advanced Practice Providers (APPs -  Physician Assistants and Nurse Practitioners) who all work together to provide you with the care you need, when you need it.  We recommend signing up for the patient portal called MyChart.  Sign up information is provided on this After Visit Summary.  MyChart is used to connect with patients for Virtual Visits (Telemedicine).  Patients are able to view lab/test results, encounter notes, upcoming appointments, etc.  Non-urgent messages can be sent to your provider as well.   To learn more about what you can do with MyChart, go to [email or web address].    Your next appointment:   1 year(s)  Provider:   Artist Pouch, PA-C or  Maude Emmer, MD
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
## 2023-11-06 LAB — COMPREHENSIVE METABOLIC PANEL
ALT: 23 [IU]/L (ref 0–32)
AST: 26 [IU]/L (ref 0–40)
Albumin: 4.4 g/dL (ref 3.8–4.8)
Alkaline Phosphatase: 67 [IU]/L (ref 44–121)
BUN/Creatinine Ratio: 11 — ABNORMAL LOW (ref 12–28)
BUN: 11 mg/dL (ref 8–27)
Bilirubin Total: 0.3 mg/dL (ref 0.0–1.2)
CO2: 26 mmol/L (ref 20–29)
Calcium: 9.9 mg/dL (ref 8.7–10.3)
Chloride: 107 mmol/L — ABNORMAL HIGH (ref 96–106)
Creatinine, Ser: 0.97 mg/dL (ref 0.57–1.00)
Globulin, Total: 2.3 g/dL (ref 1.5–4.5)
Glucose: 106 mg/dL — ABNORMAL HIGH (ref 70–99)
Potassium: 4.5 mmol/L (ref 3.5–5.2)
Sodium: 146 mmol/L — ABNORMAL HIGH (ref 134–144)
Total Protein: 6.7 g/dL (ref 6.0–8.5)
eGFR: 62 mL/min/{1.73_m2} (ref >59)

## 2023-11-06 LAB — LDL CHOLESTEROL, DIRECT: LDL Direct: 74 mg/dL (ref 0–99)

## 2023-11-22 ENCOUNTER — Other Ambulatory Visit: Payer: Self-pay

## 2023-11-22 ENCOUNTER — Encounter: Payer: Self-pay | Admitting: *Deleted

## 2023-11-22 ENCOUNTER — Ambulatory Visit
Admission: EM | Admit: 2023-11-22 | Discharge: 2023-11-22 | Disposition: A | Discharge status: Home / Self Care | Payer: 59

## 2023-11-22 DIAGNOSIS — S8011XA Contusion of right lower leg, initial encounter: Secondary | ICD-10-CM | POA: Diagnosis not present

## 2023-11-22 NOTE — Discharge Instructions (Signed)
 Return if any problems.

## 2023-11-22 NOTE — ED Triage Notes (Signed)
 Pt states she noticed a bruise on her leg Tuesday or Wednesday. When she was oiling her legs last night she noticed a "knot" under the bruise. Denies injury. Denies recent travel
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
## 2023-12-14 ENCOUNTER — Other Ambulatory Visit: Payer: Self-pay

## 2023-12-14 ENCOUNTER — Encounter: Payer: Self-pay | Admitting: *Deleted

## 2023-12-14 ENCOUNTER — Ambulatory Visit: Admission: EM | Admit: 2023-12-14 | Discharge: 2023-12-14 | Disposition: A | Discharge status: Home / Self Care

## 2023-12-14 DIAGNOSIS — R051 Acute cough: Secondary | ICD-10-CM

## 2023-12-14 DIAGNOSIS — J069 Acute upper respiratory infection, unspecified: Secondary | ICD-10-CM

## 2023-12-14 MED ORDER — AMOXICILLIN-POT CLAVULANATE 875-125 MG PO TABS: 1.0000 | ORAL_TABLET | Freq: Two times a day (BID) | ORAL | 0 refills | Status: AC

## 2023-12-14 MED ORDER — BENZONATATE 100 MG PO CAPS: 100.0000 mg | ORAL_CAPSULE | Freq: Three times a day (TID) | ORAL | 0 refills | Status: AC | PRN

## 2023-12-14 NOTE — Discharge Instructions (Signed)
 I have prescribed you an antibiotic and cough medication for upper respiratory infection and coughing.  Please follow-up if any symptoms persist or worsen.

## 2023-12-14 NOTE — ED Triage Notes (Signed)
 Cough for over a week. Now hurts right side of face when she coughs. States it feels like "a tickle in my throat". Head also hurts when she coughs.
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
## 2024-01-20 ENCOUNTER — Telehealth: Payer: Self-pay | Admitting: *Deleted

## 2024-01-20 NOTE — Telephone Encounter (Signed)
 Returned call and LVM to call office and schedule annual CT. Due 02/27/2024 or later.

## 2024-01-20 NOTE — Telephone Encounter (Signed)
 Copied from CRM [id number]. Topic: Clinical - Request for Lab/Test Order >> Jan 20, 2024  1:14 PM Veronica Spence wrote: Reason for CRM: Pt is calling to schedule her lung cancer screening. Please call the pt back at [phone].

## 2024-01-21 ENCOUNTER — Other Ambulatory Visit: Payer: Self-pay | Admitting: Emergency Medicine

## 2024-01-21 DIAGNOSIS — Z87891 Personal history of nicotine dependence: Secondary | ICD-10-CM

## 2024-01-21 DIAGNOSIS — F17210 Nicotine dependence, cigarettes, uncomplicated: Secondary | ICD-10-CM

## 2024-01-21 DIAGNOSIS — Z122 Encounter for screening for malignant neoplasm of respiratory organs: Secondary | ICD-10-CM

## 2024-03-05 ENCOUNTER — Ambulatory Visit (HOSPITAL_COMMUNITY)

## 2024-03-18 ENCOUNTER — Ambulatory Visit (HOSPITAL_COMMUNITY)
Admission: RE | Admit: 2024-03-18 | Discharge: 2024-03-18 | Disposition: A | Discharge status: Home / Self Care | Source: Ambulatory Visit | Attending: Acute Care | Admitting: Acute Care

## 2024-03-18 DIAGNOSIS — Z87891 Personal history of nicotine dependence: Secondary | ICD-10-CM | POA: Diagnosis present

## 2024-03-18 DIAGNOSIS — Z122 Encounter for screening for malignant neoplasm of respiratory organs: Secondary | ICD-10-CM | POA: Diagnosis present

## 2024-03-18 DIAGNOSIS — F17210 Nicotine dependence, cigarettes, uncomplicated: Secondary | ICD-10-CM | POA: Insufficient documentation

## 2024-03-19 ENCOUNTER — Ambulatory Visit (HOSPITAL_COMMUNITY)

## 2024-04-04 ENCOUNTER — Other Ambulatory Visit: Payer: Self-pay

## 2024-04-04 DIAGNOSIS — Z87891 Personal history of nicotine dependence: Secondary | ICD-10-CM

## 2024-04-04 DIAGNOSIS — F17210 Nicotine dependence, cigarettes, uncomplicated: Secondary | ICD-10-CM

## 2024-04-04 DIAGNOSIS — Z122 Encounter for screening for malignant neoplasm of respiratory organs: Secondary | ICD-10-CM

## 2024-04-18 ENCOUNTER — Emergency Department (HOSPITAL_BASED_OUTPATIENT_CLINIC_OR_DEPARTMENT_OTHER)

## 2024-04-18 ENCOUNTER — Emergency Department (HOSPITAL_BASED_OUTPATIENT_CLINIC_OR_DEPARTMENT_OTHER): Admitting: Radiology

## 2024-04-18 ENCOUNTER — Encounter (HOSPITAL_BASED_OUTPATIENT_CLINIC_OR_DEPARTMENT_OTHER): Payer: Self-pay

## 2024-04-18 ENCOUNTER — Emergency Department (HOSPITAL_BASED_OUTPATIENT_CLINIC_OR_DEPARTMENT_OTHER)
Admission: EM | Admit: 2024-04-18 | Discharge: 2024-04-18 | Disposition: A | Discharge status: Home / Self Care | Attending: Emergency Medicine | Admitting: Emergency Medicine

## 2024-04-18 ENCOUNTER — Other Ambulatory Visit: Payer: Self-pay

## 2024-04-18 DIAGNOSIS — M79601 Pain in right arm: Secondary | ICD-10-CM | POA: Insufficient documentation

## 2024-04-18 DIAGNOSIS — I10 Essential (primary) hypertension: Secondary | ICD-10-CM | POA: Insufficient documentation

## 2024-04-18 DIAGNOSIS — Z7982 Long term (current) use of aspirin: Secondary | ICD-10-CM | POA: Diagnosis not present

## 2024-04-18 DIAGNOSIS — I2510 Atherosclerotic heart disease of native coronary artery without angina pectoris: Secondary | ICD-10-CM | POA: Insufficient documentation

## 2024-04-18 DIAGNOSIS — N3001 Acute cystitis with hematuria: Secondary | ICD-10-CM | POA: Diagnosis not present

## 2024-04-18 DIAGNOSIS — J069 Acute upper respiratory infection, unspecified: Secondary | ICD-10-CM | POA: Diagnosis not present

## 2024-04-18 DIAGNOSIS — Z79899 Other long term (current) drug therapy: Secondary | ICD-10-CM | POA: Insufficient documentation

## 2024-04-18 DIAGNOSIS — R059 Cough, unspecified: Secondary | ICD-10-CM | POA: Diagnosis present

## 2024-04-18 LAB — COMPREHENSIVE METABOLIC PANEL WITH GFR
ALT: 28 U/L (ref 0–44)
AST: 31 U/L (ref 15–41)
Albumin: 4.3 g/dL (ref 3.5–5.0)
Alkaline Phosphatase: 55 U/L (ref 38–126)
Anion gap: 11 (ref 5–15)
BUN: 10 mg/dL (ref 8–23)
CO2: 26 mmol/L (ref 22–32)
Calcium: 9.5 mg/dL (ref 8.9–10.3)
Chloride: 107 mmol/L (ref 98–111)
Creatinine, Ser: 0.95 mg/dL (ref 0.44–1.00)
GFR, Estimated: >60 mL/min (ref >60)
Glucose, Bld: 96 mg/dL (ref 70–99)
Potassium: 3.8 mmol/L (ref 3.5–5.1)
Sodium: 143 mmol/L (ref 135–145)
Total Bilirubin: 0.3 mg/dL (ref 0.0–1.2)
Total Protein: 6.9 g/dL (ref 6.5–8.1)

## 2024-04-18 LAB — URINALYSIS, ROUTINE W REFLEX MICROSCOPIC
Bilirubin Urine: NEGATIVE
Glucose, UA: NEGATIVE mg/dL
Ketones, ur: NEGATIVE mg/dL
Nitrite: NEGATIVE
Specific Gravity, Urine: 1.032 — ABNORMAL HIGH (ref 1.005–1.030)
pH: 6 (ref 5.0–8.0)

## 2024-04-18 LAB — CBC WITH DIFFERENTIAL/PLATELET
Abs Immature Granulocytes: 0.01 K/uL (ref 0.00–0.07)
Basophils Absolute: 0 K/uL (ref 0.0–0.1)
Basophils Relative: 0 %
Eosinophils Absolute: 0.1 K/uL (ref 0.0–0.5)
Eosinophils Relative: 1 %
HCT: 42.8 % (ref 36.0–46.0)
Hemoglobin: 13.3 g/dL (ref 12.0–15.0)
Immature Granulocytes: 0 %
Lymphocytes Relative: 39 %
Lymphs Abs: 2.2 K/uL (ref 0.7–4.0)
MCH: 25.5 pg — ABNORMAL LOW (ref 26.0–34.0)
MCHC: 31.1 g/dL (ref 30.0–36.0)
MCV: 82.1 fL (ref 80.0–100.0)
Monocytes Absolute: 0.5 K/uL (ref 0.1–1.0)
Monocytes Relative: 8 %
Neutro Abs: 2.9 K/uL (ref 1.7–7.7)
Neutrophils Relative %: 52 %
Platelets: 203 K/uL (ref 150–400)
RBC: 5.21 MIL/uL — ABNORMAL HIGH (ref 3.87–5.11)
RDW: 15.8 % — ABNORMAL HIGH (ref 11.5–15.5)
WBC: 5.7 K/uL (ref 4.0–10.5)
nRBC: 0 % (ref 0.0–0.2)

## 2024-04-18 MED ORDER — CEPHALEXIN 500 MG PO CAPS: 500.0000 mg | ORAL_CAPSULE | Freq: Three times a day (TID) | ORAL | 0 refills | Status: AC

## 2024-04-18 MED ORDER — CEPHALEXIN 250 MG PO CAPS
500.0000 mg | ORAL_CAPSULE | Freq: Once | ORAL | Status: AC
  Administered 2024-04-18: 500 mg via ORAL
  Filled 2024-04-18: qty 2

## 2024-04-18 NOTE — ED Notes (Signed)
 Blue save sent to lab

## 2024-04-18 NOTE — ED Triage Notes (Signed)
 Pt c/o R arm pain, knot in my arm (AC area), hurts all the way up. No swelling noted, CNS  intact distally. Also c/o L flank/ lower back pain, no associated urinary complaint.

## 2024-04-19 NOTE — ED Provider Notes (Signed)
 [place] EMERGENCY DEPARTMENT AT [facility] Provider Note   CSN: [id number] Arrival date & time: 04/18/24  8182     Patient presents with: Arm Pain (R) and Flank Pain (L)   Veronica Spence is a 73 y.o. female.   Pt is a 73 yo female with pmhx significant for hld, diverticulitis, ddd, chronic back pain, gerd, htn, cad, osteoporosis, and atherosclerosis.  Pt has had a cough for a few days, she has left upper back pain which is in a different spot than her usual back pain and she has a painful lump in her right arm.  No swelling in arm.  Some dysuria.  No numbness in arm.       Prior to Admission medications   Medication Sig Start Date End Date Taking? Authorizing Provider  cephALEXin  (KEFLEX ) 500 MG capsule Take 1 capsule (500 mg total) by mouth 3 (three) times daily. 04/18/24  Yes Dean Clarity, MD  alendronate (FOSAMAX) 70 MG tablet Take 70 mg by mouth once a week. 10/14/23   [provider]  amLODipine (NORVASC) 5 MG tablet Take 5 mg by mouth daily. 07/08/21   [provider]  amoxicillin -clavulanate (AUGMENTIN ) 875-125 MG tablet Take 1 tablet by mouth every 12 (twelve) hours. 12/14/23   Hazen Darryle BRAVO, FNP  aspirin EC 81 MG tablet Take 81 mg by mouth daily.    [provider]  benzonatate  (TESSALON ) 100 MG capsule Take 1 capsule (100 mg total) by mouth every 8 (eight) hours as needed for cough. 12/14/23   Hazen Darryle BRAVO, FNP  cyclobenzaprine (FLEXERIL) 10 MG tablet Take 10 mg by mouth 3 (three) times daily as needed. 12/09/23   [provider]  diclofenac  sodium (VOLTAREN ) 1 % GEL Apply 2 g topically 4 (four) times daily. Patient not taking: Reported on 12/14/2023 03/04/19   Geofm Glade PARAS, MD  ezetimibe -simvastatin  (VYTORIN ) 10-20 MG tablet Take 1 tablet by mouth daily. Patient not taking: Reported on 12/14/2023 09/06/19   Jason Leita Repine, FNP  Multiple Vitamin (MULTIVITAMIN WITH MINERALS) TABS Take 1 tablet by mouth daily.     [provider]  oxyCODONE (ROXICODONE) 15 MG immediate release tablet Take 15 mg by mouth 4 (four) times daily as needed. 12/09/23   [provider]  oxyCODONE-Acetaminophen  (PERCOCET PO) Take 15 mg by mouth. Patient not taking: Reported on 12/14/2023    [provider]  pantoprazole (PROTONIX) 40 MG tablet Take 40 mg by mouth daily.    [provider]  Vitamin D , Ergocalciferol , (DRISDOL) 1.25 MG (50000 UNIT) CAPS capsule Take 50,000 Units by mouth once a week. 07/23/23   [provider]    Allergies: Tetracyclines & related, Flonase [fluticasone], and Sulfa  antibiotics    Review of Systems  Respiratory:  Positive for cough.   Musculoskeletal:  Positive for back pain.       Right arm mass  All other systems reviewed and are negative.   Updated Vital Signs BP (!) 170/65 (BP Location: Right Arm)   Pulse (!) 51   Temp 97.9 F (36.6 C)   Resp 18   SpO2 100%   Physical Exam Vitals and nursing note reviewed.  Constitutional:      Appearance: Normal appearance.  HENT:     Head: Normocephalic and atraumatic.     Right Ear: External ear normal.     Left Ear: External ear normal.     Nose: Nose normal.     Mouth/Throat:     Mouth: Mucous  membranes are moist.     Pharynx: Oropharynx is clear.  Eyes:     Extraocular Movements: Extraocular movements intact.     Conjunctiva/sclera: Conjunctivae normal.     Pupils: Pupils are equal, round, and reactive to light.  Cardiovascular:     Rate and Rhythm: Normal rate and regular rhythm.     Pulses: Normal pulses.     Heart sounds: Normal heart sounds.  Pulmonary:     Effort: Pulmonary effort is normal.     Breath sounds: Normal breath sounds.  Abdominal:     General: Abdomen is flat. Bowel sounds are normal.     Palpations: Abdomen is soft.  Musculoskeletal:       Arms:     Cervical back: Normal range of motion and neck supple.     Comments: Just distal to the ac fossa is a non-mobile,  mildly tender mass.  ? Lipoma.  No fluctuance.  Skin:    General: Skin is warm.     Capillary Refill: Capillary refill takes less than 2 seconds.  Neurological:     General: No focal deficit present.     Mental Status: She is alert and oriented to person, place, and time.  Psychiatric:        Mood and Affect: Mood normal.        Behavior: Behavior normal.     (all labs ordered are listed, but only abnormal results are displayed) Labs Reviewed  URINALYSIS, ROUTINE W REFLEX MICROSCOPIC - Abnormal; Notable for the following components:      Result Value   Specific Gravity, Urine 1.032 (*)    Hgb urine dipstick SMALL (*)    Protein, ur TRACE (*)    Leukocytes,Ua MODERATE (*)    Bacteria, UA RARE (*)    All other components within normal limits  CBC WITH DIFFERENTIAL/PLATELET - Abnormal; Notable for the following components:   RBC 5.21 (*)    MCH 25.5 (*)    RDW 15.8 (*)    All other components within normal limits  COMPREHENSIVE METABOLIC PANEL WITH GFR    EKG: None  Radiology: US  Venous Img Upper Uni Right(DVT) Result Date: 04/18/2024 CLINICAL DATA:  Palpable knot within the right AC area with pain in the right shoulder. EXAM: RIGHT UPPER EXTREMITY VENOUS DOPPLER ULTRASOUND TECHNIQUE: Gray-scale sonography with graded compression, as well as color Doppler and duplex ultrasound were performed to evaluate the upper extremity deep venous system from the level of the subclavian vein and including the jugular, axillary, basilic, radial, ulnar and upper cephalic vein. Spectral Doppler was utilized to evaluate flow at rest and with distal augmentation maneuvers. COMPARISON:  None Available. FINDINGS: Contralateral Subclavian Vein: Respiratory phasicity is normal and symmetric with the symptomatic side. No evidence of thrombus. Normal compressibility. Internal Jugular Vein: No evidence of thrombus. Normal compressibility, respiratory phasicity and response to augmentation. Subclavian Vein:  No evidence of thrombus. Normal compressibility, respiratory phasicity and response to augmentation. Axillary Vein: No evidence of thrombus. Normal compressibility, respiratory phasicity and response to augmentation. Cephalic Vein: No evidence of thrombus. Normal compressibility, respiratory phasicity and response to augmentation. Basilic Vein: No evidence of thrombus. Normal compressibility, respiratory phasicity and response to augmentation. Brachial Veins: No evidence of thrombus. Normal compressibility, respiratory phasicity and response to augmentation. Radial Veins: No evidence of thrombus. Normal compressibility, respiratory phasicity and response to augmentation. Ulnar Veins: No evidence of thrombus. Normal compressibility, respiratory phasicity and response to augmentation. Venous Reflux:  None visualized. Other Findings:  No  obvious abnormality within the right [facility] area. IMPRESSION: No evidence of DVT within the RIGHT upper extremity. Electronically Signed   By: Suzen Dials M.D.   On: 04/18/2024 21:46   DG Chest 2 View Result Date: 04/18/2024 EXAM: 2 VIEW(S) XRAY OF THE CHEST 04/18/2024 08:53:00 PM COMPARISON: 01/26/2017 CLINICAL HISTORY: Cough. Pt c/o R arm pain, knot in my arm (AC area), hurts all the way up. No swelling noted, CNS intact distally. Also c/o L flank/lower back pain, no associated urinary complaint. FINDINGS: LUNGS AND PLEURA: No focal pulmonary opacity. No pulmonary edema. No pleural effusion. No pneumothorax. HEART AND MEDIASTINUM: No acute abnormality of the cardiac and mediastinal silhouettes. Calcified aorta. BONES AND SOFT TISSUES: No acute osseous abnormality. Thoracic degenerative changes. IMPRESSION: 1. No acute findings. Electronically signed by: Pinkie Pebbles MD 04/18/2024 08:58 PM EDT RP Workstation: [id number]     Procedures   Medications Ordered in the ED  cephALEXin  (KEFLEX ) capsule 500 mg (500 mg Oral Given 04/18/24 2209)                                     Medical Decision Making Amount and/or Complexity of Data Reviewed Labs: ordered. Radiology: ordered.  Risk Prescription drug management.   This patient presents to the ED for concern of cough, back pain, and arm mass, this involves an extensive number of treatment options, and is a complaint that carries with it a high risk of complications and morbidity.  The differential diagnosis includes dvt, lipoma, uri, uti, pyelo   Co morbidities that complicate the patient evaluation  hld, diverticulitis, ddd, chronic back pain, gerd, htn, cad, osteoporosis, and atherosclerosis   Additional history obtained:  Additional history obtained from epic chart review  Lab Tests:  I Ordered, and personally interpreted labs.  The pertinent results include:  cbc nl, cmp nl, ua + uti   Imaging Studies ordered:  I ordered imaging studies including cxr and RUE US   I independently visualized and interpreted imaging which showed  CXR: No acute findings.  US : No evidence of DVT within the RIGHT upper extremity.  I agree with the radiologist interpretation   Medicines ordered and prescription drug management:  I ordered medication including keflex   for sx  Reevaluation of the patient after these medicines showed that the patient improved I have reviewed the patients home medicines and have made adjustments as needed   Test Considered:  ct   Problem List / ED Course:  RUE mass:  unclear etiology.  Possible lipoma.  No abn on US .  Pt is to f/u with pcp. Cough/UTI:  pt started on keflex .  Doubt pyelo as pt is afebrile and nontoxic.   Reevaluation:  After the interventions noted above, I reevaluated the patient and found that they have :improved   Social Determinants of Health:  Lives at home   Dispostion:  After consideration of the diagnostic results and the patients response to treatment, I feel that the patent would benefit from discharge with outpatient f/u.        Final diagnoses:  Pain of right upper extremity  Upper respiratory tract infection, unspecified type  Acute cystitis with hematuria    ED Discharge Orders          Ordered    cephALEXin  (KEFLEX ) 500 MG capsule  3 times daily        04/18/24 2153  Dean Clarity, MD 04/19/24 [phone]

## 2024-10-18 ENCOUNTER — Other Ambulatory Visit: Payer: Self-pay | Admitting: Registered Nurse

## 2024-10-18 DIAGNOSIS — R928 Other abnormal and inconclusive findings on diagnostic imaging of breast: Secondary | ICD-10-CM

## 2024-10-19 ENCOUNTER — Other Ambulatory Visit: Payer: Self-pay | Admitting: Registered Nurse

## 2024-10-19 DIAGNOSIS — N6310 Unspecified lump in the right breast, unspecified quadrant: Secondary | ICD-10-CM

## 2024-10-19 DIAGNOSIS — R928 Other abnormal and inconclusive findings on diagnostic imaging of breast: Secondary | ICD-10-CM

## 2024-11-07 ENCOUNTER — Other Ambulatory Visit

## 2024-11-07 ENCOUNTER — Encounter
